# Patient Record
Sex: Male | Born: 1975 | Race: Black or African American | Hispanic: No | Marital: Married | Smoking: Never smoker
Health system: Southern US, Community
[De-identification: ages and names within clinical notes are randomized; demographics above are authoritative.]

## PROBLEM LIST (undated history)

## (undated) ENCOUNTER — Ambulatory Visit: Admission: EM | Payer: No Typology Code available for payment source

## (undated) DIAGNOSIS — Z8639 Personal history of other endocrine, nutritional and metabolic disease: Secondary | ICD-10-CM

## (undated) DIAGNOSIS — E669 Obesity, unspecified: Secondary | ICD-10-CM

## (undated) DIAGNOSIS — E039 Hypothyroidism, unspecified: Secondary | ICD-10-CM

## (undated) DIAGNOSIS — M545 Low back pain, unspecified: Secondary | ICD-10-CM

## (undated) DIAGNOSIS — G8929 Other chronic pain: Secondary | ICD-10-CM

## (undated) DIAGNOSIS — R7301 Impaired fasting glucose: Secondary | ICD-10-CM

## (undated) DIAGNOSIS — Z8042 Family history of malignant neoplasm of prostate: Secondary | ICD-10-CM

## (undated) HISTORY — DX: Hypothyroidism, unspecified: E03.9

## (undated) HISTORY — DX: Low back pain: M54.5

## (undated) HISTORY — DX: Family history of malignant neoplasm of prostate: Z80.42

## (undated) HISTORY — DX: Low back pain, unspecified: M54.50

## (undated) HISTORY — DX: Obesity, unspecified: E66.9

## (undated) HISTORY — DX: Personal history of other endocrine, nutritional and metabolic disease: Z86.39

## (undated) HISTORY — DX: Other chronic pain: G89.29

## (undated) HISTORY — DX: Impaired fasting glucose: R73.01

---

## 2002-04-22 ENCOUNTER — Emergency Department (HOSPITAL_COMMUNITY): Admission: EM | Admit: 2002-04-22 | Discharge: 2002-04-22 | Payer: Self-pay | Admitting: Emergency Medicine

## 2003-01-20 ENCOUNTER — Emergency Department (HOSPITAL_COMMUNITY): Admission: EM | Admit: 2003-01-20 | Discharge: 2003-01-20 | Payer: Self-pay | Admitting: Emergency Medicine

## 2003-01-24 ENCOUNTER — Emergency Department (HOSPITAL_COMMUNITY): Admission: EM | Admit: 2003-01-24 | Discharge: 2003-01-24 | Payer: Self-pay | Admitting: Emergency Medicine

## 2003-02-05 ENCOUNTER — Ambulatory Visit (HOSPITAL_COMMUNITY): Admission: RE | Admit: 2003-02-05 | Discharge: 2003-02-05 | Payer: Self-pay | Admitting: Cardiology

## 2003-03-09 ENCOUNTER — Emergency Department (HOSPITAL_COMMUNITY): Admission: EM | Admit: 2003-03-09 | Discharge: 2003-03-09 | Payer: Self-pay | Admitting: Emergency Medicine

## 2003-03-10 ENCOUNTER — Emergency Department (HOSPITAL_COMMUNITY): Admission: EM | Admit: 2003-03-10 | Discharge: 2003-03-10 | Payer: Self-pay | Admitting: Family Medicine

## 2003-04-15 ENCOUNTER — Emergency Department (HOSPITAL_COMMUNITY): Admission: AD | Admit: 2003-04-15 | Discharge: 2003-04-15 | Payer: Self-pay | Admitting: Family Medicine

## 2004-04-24 ENCOUNTER — Emergency Department (HOSPITAL_COMMUNITY): Admission: EM | Admit: 2004-04-24 | Discharge: 2004-04-24 | Payer: Self-pay | Admitting: Emergency Medicine

## 2004-05-16 ENCOUNTER — Ambulatory Visit: Payer: Self-pay | Admitting: Internal Medicine

## 2004-05-18 ENCOUNTER — Encounter (HOSPITAL_COMMUNITY): Admission: RE | Admit: 2004-05-18 | Discharge: 2004-08-16 | Payer: Self-pay | Admitting: Internal Medicine

## 2004-05-23 ENCOUNTER — Ambulatory Visit: Payer: Self-pay | Admitting: Internal Medicine

## 2004-06-21 ENCOUNTER — Ambulatory Visit: Payer: Self-pay | Admitting: Internal Medicine

## 2004-10-06 ENCOUNTER — Ambulatory Visit: Payer: Self-pay | Admitting: Internal Medicine

## 2004-10-13 ENCOUNTER — Ambulatory Visit: Payer: Self-pay | Admitting: Internal Medicine

## 2004-11-18 ENCOUNTER — Ambulatory Visit: Payer: Self-pay | Admitting: Internal Medicine

## 2004-11-30 ENCOUNTER — Ambulatory Visit: Payer: Self-pay | Admitting: Internal Medicine

## 2005-04-19 ENCOUNTER — Emergency Department (HOSPITAL_COMMUNITY): Admission: EM | Admit: 2005-04-19 | Discharge: 2005-04-19 | Payer: Self-pay | Admitting: Emergency Medicine

## 2005-05-10 ENCOUNTER — Emergency Department (HOSPITAL_COMMUNITY): Admission: EM | Admit: 2005-05-10 | Discharge: 2005-05-10 | Payer: Self-pay | Admitting: Emergency Medicine

## 2005-11-14 ENCOUNTER — Emergency Department (HOSPITAL_COMMUNITY): Admission: EM | Admit: 2005-11-14 | Discharge: 2005-11-15 | Payer: Self-pay | Admitting: Emergency Medicine

## 2005-12-01 DIAGNOSIS — IMO0002 Reserved for concepts with insufficient information to code with codable children: Secondary | ICD-10-CM

## 2005-12-01 DIAGNOSIS — E0590 Thyrotoxicosis, unspecified without thyrotoxic crisis or storm: Secondary | ICD-10-CM | POA: Insufficient documentation

## 2006-01-07 ENCOUNTER — Emergency Department (HOSPITAL_COMMUNITY): Admission: EM | Admit: 2006-01-07 | Discharge: 2006-01-07 | Payer: Self-pay | Admitting: Emergency Medicine

## 2006-03-12 ENCOUNTER — Emergency Department (HOSPITAL_COMMUNITY): Admission: EM | Admit: 2006-03-12 | Discharge: 2006-03-12 | Payer: Self-pay | Admitting: Emergency Medicine

## 2006-10-02 ENCOUNTER — Emergency Department (HOSPITAL_COMMUNITY): Admission: EM | Admit: 2006-10-02 | Discharge: 2006-10-02 | Payer: Self-pay | Admitting: *Deleted

## 2007-01-24 DIAGNOSIS — Z8639 Personal history of other endocrine, nutritional and metabolic disease: Secondary | ICD-10-CM

## 2007-01-24 HISTORY — DX: Personal history of other endocrine, nutritional and metabolic disease: Z86.39

## 2008-08-23 ENCOUNTER — Emergency Department (HOSPITAL_COMMUNITY): Admission: EM | Admit: 2008-08-23 | Discharge: 2008-08-23 | Payer: Self-pay | Admitting: Emergency Medicine

## 2010-05-07 ENCOUNTER — Emergency Department (HOSPITAL_COMMUNITY)
Admission: EM | Admit: 2010-05-07 | Discharge: 2010-05-07 | Disposition: A | Discharge status: Home / Self Care | Payer: Self-pay | Attending: Emergency Medicine | Admitting: Emergency Medicine

## 2010-05-07 ENCOUNTER — Emergency Department (HOSPITAL_COMMUNITY): Payer: Self-pay

## 2010-05-07 DIAGNOSIS — M545 Low back pain, unspecified: Secondary | ICD-10-CM | POA: Insufficient documentation

## 2010-05-07 DIAGNOSIS — I10 Essential (primary) hypertension: Secondary | ICD-10-CM | POA: Insufficient documentation

## 2010-05-07 DIAGNOSIS — R229 Localized swelling, mass and lump, unspecified: Secondary | ICD-10-CM | POA: Insufficient documentation

## 2010-05-07 DIAGNOSIS — M5380 Other specified dorsopathies, site unspecified: Secondary | ICD-10-CM | POA: Insufficient documentation

## 2010-05-07 LAB — URINALYSIS, ROUTINE W REFLEX MICROSCOPIC
Glucose, UA: NEGATIVE mg/dL
Protein, ur: NEGATIVE mg/dL
Specific Gravity, Urine: 1.023 (ref 1.005–1.030)

## 2010-10-06 ENCOUNTER — Emergency Department (HOSPITAL_COMMUNITY)
Admission: EM | Admit: 2010-10-06 | Discharge: 2010-10-06 | Disposition: A | Discharge status: Home / Self Care | Payer: Self-pay | Attending: Emergency Medicine | Admitting: Emergency Medicine

## 2010-10-06 DIAGNOSIS — K089 Disorder of teeth and supporting structures, unspecified: Secondary | ICD-10-CM | POA: Insufficient documentation

## 2010-10-06 DIAGNOSIS — I10 Essential (primary) hypertension: Secondary | ICD-10-CM | POA: Insufficient documentation

## 2010-10-06 DIAGNOSIS — K047 Periapical abscess without sinus: Secondary | ICD-10-CM | POA: Insufficient documentation

## 2010-10-08 ENCOUNTER — Emergency Department (HOSPITAL_COMMUNITY)
Admission: EM | Admit: 2010-10-08 | Discharge: 2010-10-08 | Disposition: A | Discharge status: Home / Self Care | Payer: Self-pay | Attending: Emergency Medicine | Admitting: Emergency Medicine

## 2010-10-08 DIAGNOSIS — K055 Other periodontal diseases: Secondary | ICD-10-CM | POA: Insufficient documentation

## 2010-10-08 DIAGNOSIS — I10 Essential (primary) hypertension: Secondary | ICD-10-CM | POA: Insufficient documentation

## 2010-10-08 DIAGNOSIS — Z9889 Other specified postprocedural states: Secondary | ICD-10-CM | POA: Insufficient documentation

## 2010-10-08 LAB — DIFFERENTIAL
Lymphs Abs: 1.9 10*3/uL (ref 0.7–4.0)
Monocytes Absolute: 0.8 10*3/uL (ref 0.1–1.0)
Monocytes Relative: 11 % (ref 3–12)
Neutro Abs: 4 10*3/uL (ref 1.7–7.7)
Neutrophils Relative %: 59 % (ref 43–77)

## 2010-10-08 LAB — PROTIME-INR
INR: 1.01 (ref 0.00–1.49)
Prothrombin Time: 13.5 seconds (ref 11.6–15.2)

## 2010-10-08 LAB — CBC
HCT: 37.9 % — ABNORMAL LOW (ref 39.0–52.0)
MCH: 28.9 pg (ref 26.0–34.0)
MCHC: 34.8 g/dL (ref 30.0–36.0)
MCV: 82.9 fL (ref 78.0–100.0)
Platelets: 233 10*3/uL (ref 150–400)
RDW: 12.9 % (ref 11.5–15.5)

## 2010-10-08 LAB — APTT: aPTT: 28 seconds (ref 24–37)

## 2012-03-14 IMAGING — CR DG LUMBAR SPINE COMPLETE 4+V
5 series · 5 of 5 positions shown · non-contrast
Comparison: 10/02/2006.

CLINICAL DATA: Low back pain.

LUMBAR SPINE - COMPLETE 4+ VIEW

[t l-spine a.p.]
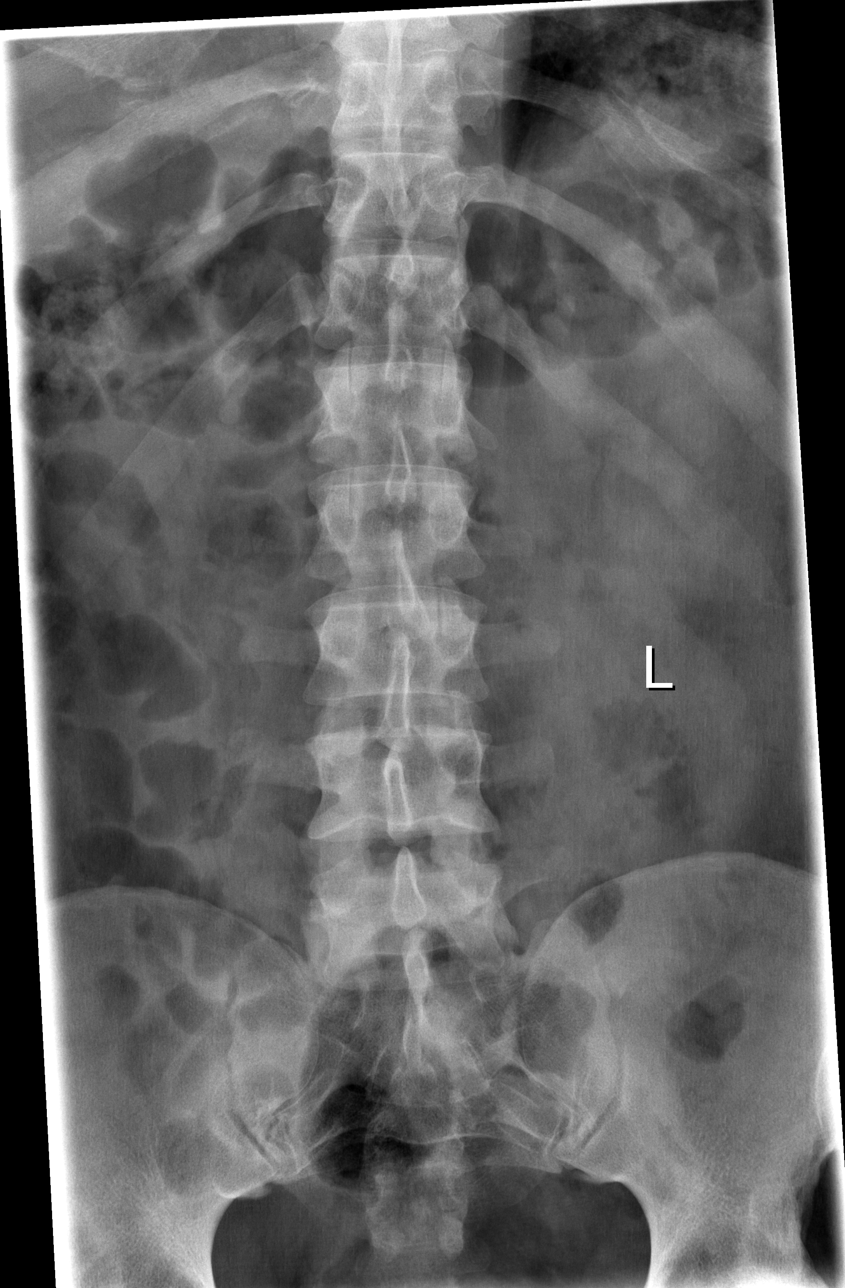

[t l-spine oblique exposure (1 of 2)]
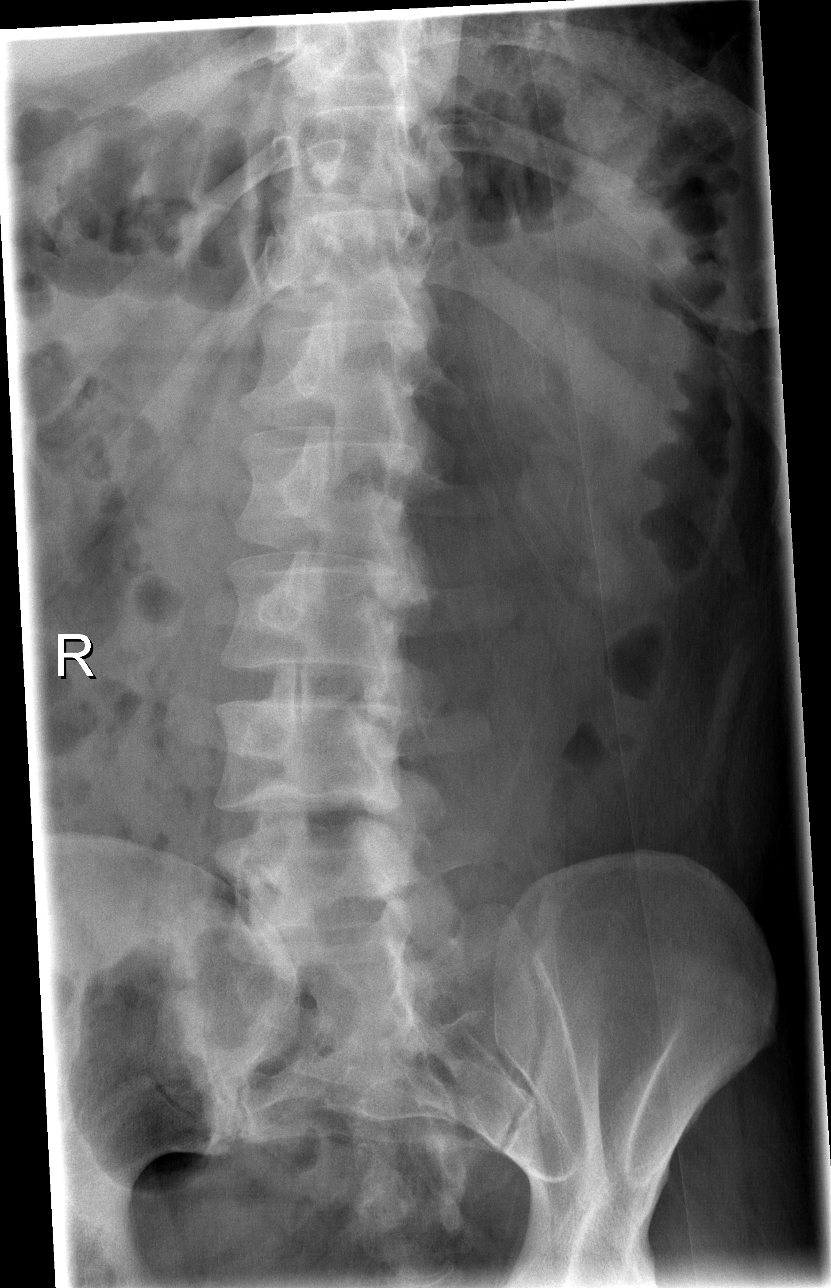

[t l-spine oblique exposure (2 of 2)]
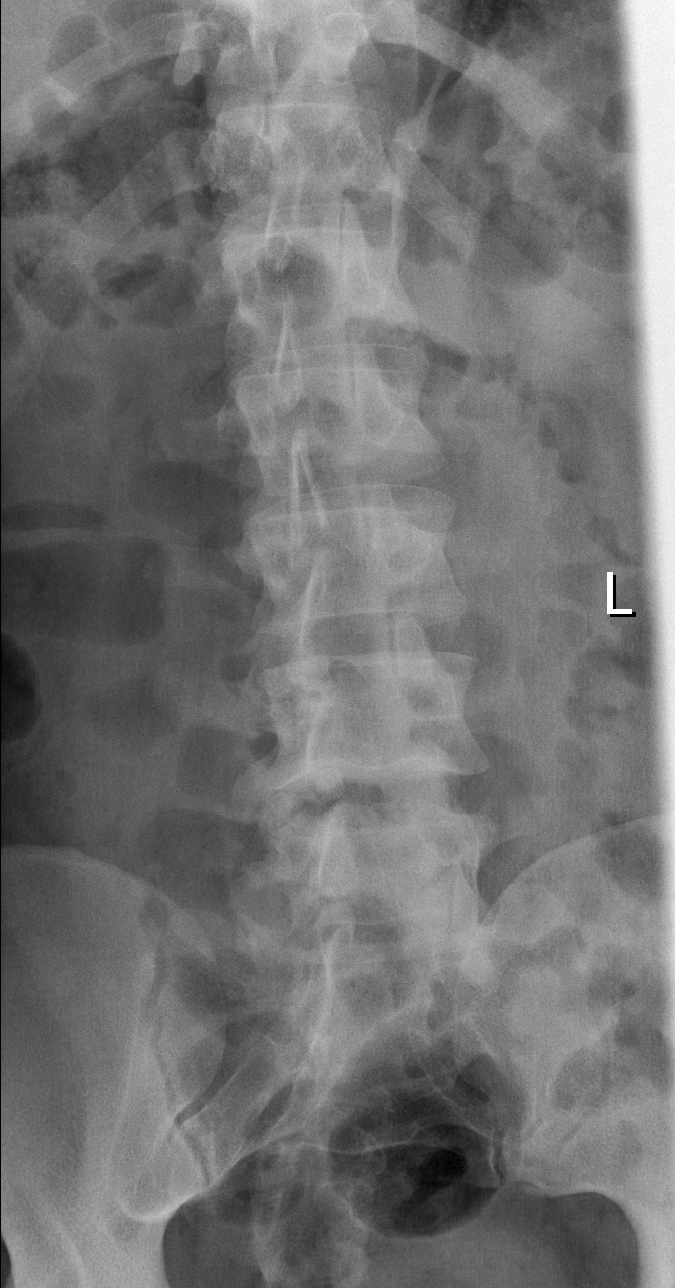

[t l-spine lat]
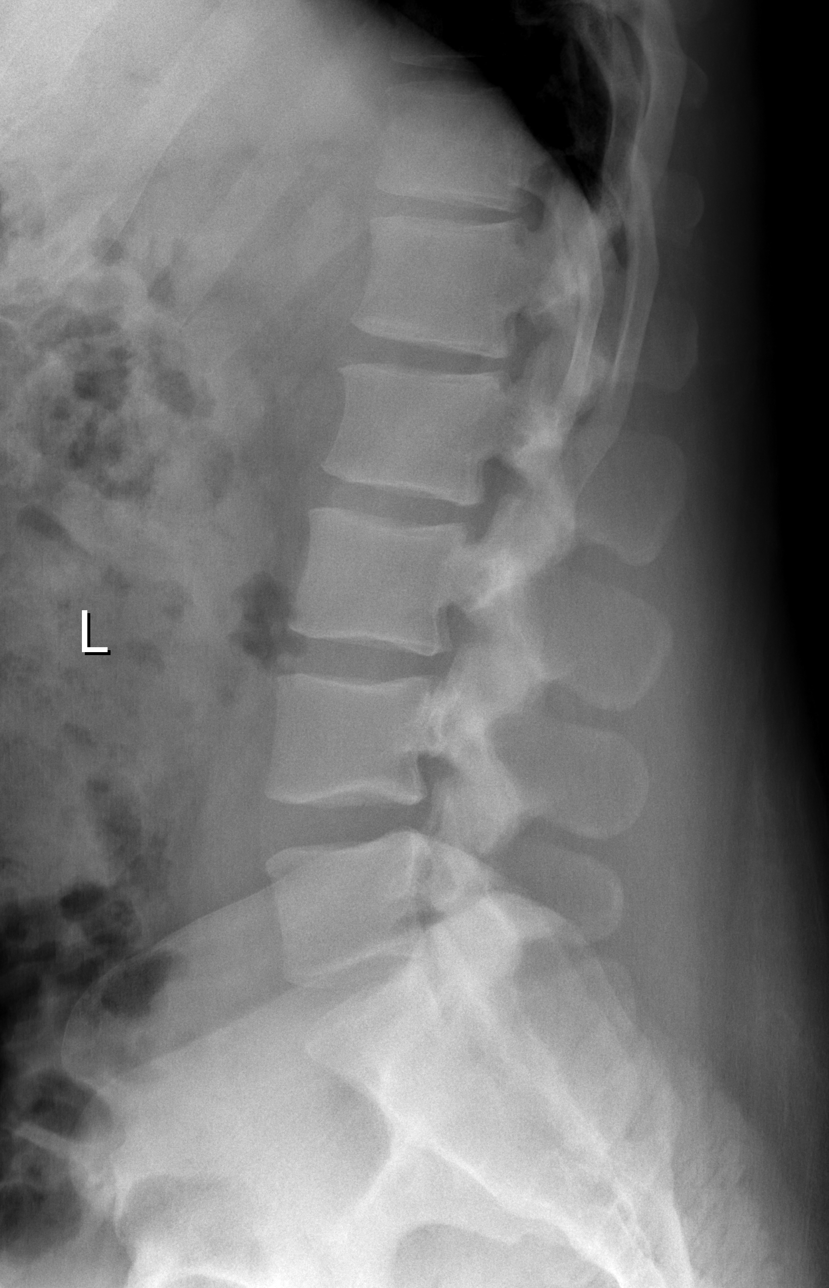

[t l-spine l5-s1 spot]
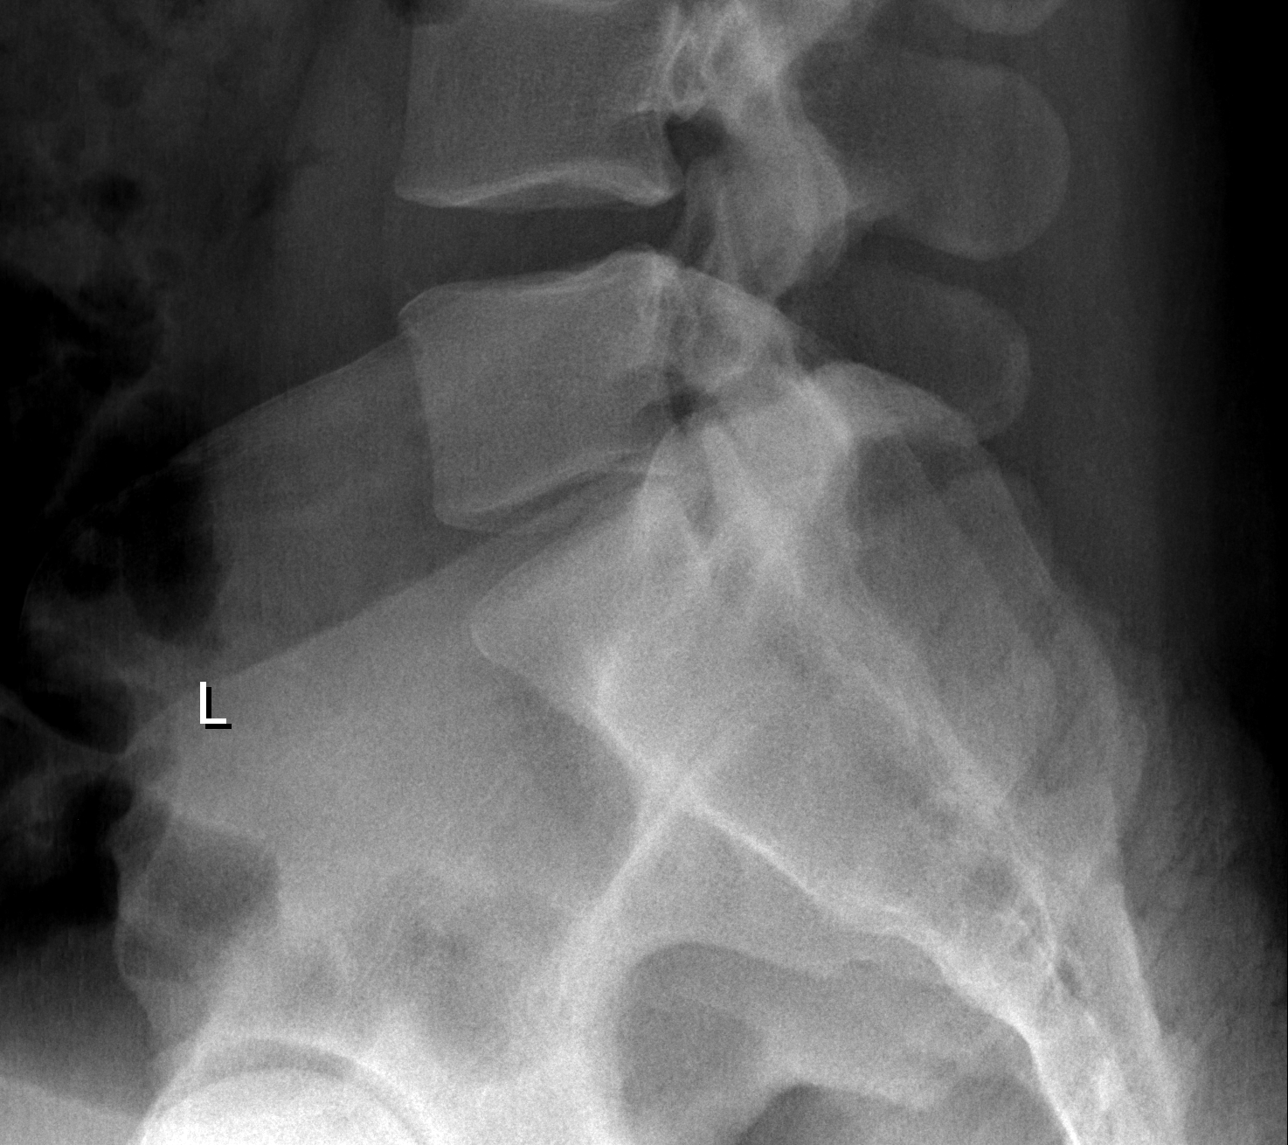

[5 of 5 positions shown; findings below may reference images not displayed]

FINDINGS: Five non-rib bearing lumbar type vertebral bodies are
present.  The vertebral body heights and disc spaces are preserved.
No acute fracture or traumatic subluxation is evident.
IMPRESSION: Negative lumbar spine.

## 2013-04-30 ENCOUNTER — Emergency Department (HOSPITAL_COMMUNITY)
Admission: EM | Admit: 2013-04-30 | Discharge: 2013-04-30 | Disposition: A | Discharge status: Home / Self Care | Payer: BC Managed Care – PPO | Attending: Emergency Medicine | Admitting: Emergency Medicine

## 2013-04-30 ENCOUNTER — Encounter (HOSPITAL_COMMUNITY): Payer: Self-pay | Admitting: Emergency Medicine

## 2013-04-30 DIAGNOSIS — L720 Epidermal cyst: Secondary | ICD-10-CM

## 2013-04-30 DIAGNOSIS — F1210 Cannabis abuse, uncomplicated: Secondary | ICD-10-CM | POA: Insufficient documentation

## 2013-04-30 DIAGNOSIS — L723 Sebaceous cyst: Secondary | ICD-10-CM | POA: Insufficient documentation

## 2013-04-30 MED ORDER — CYCLOBENZAPRINE HCL 10 MG PO TABS: 10.0000 mg | ORAL_TABLET | Freq: Two times a day (BID) | ORAL | Status: DC | PRN

## 2013-04-30 MED ORDER — CEPHALEXIN 500 MG PO CAPS: 500.0000 mg | ORAL_CAPSULE | Freq: Four times a day (QID) | ORAL | Status: DC

## 2013-04-30 NOTE — ED Notes (Signed)
Pt c/o lower back pain intermittent x 1 month.  Pt denies injury/trauma.  Pt ambulatory without assistance.

## 2013-04-30 NOTE — ED Provider Notes (Signed)
Medical screening examination/treatment/procedure(s) were performed by non-physician practitioner and as supervising physician I was immediately available for consultation/collaboration.   EKG Interpretation None        Junius ArgyleForrest S Dajohn Ellender, MD 04/30/13 2203

## 2013-04-30 NOTE — Discharge Instructions (Signed)
Epidermal Cyst An epidermal cyst is sometimes called a sebaceous cyst, epidermal inclusion cyst, or infundibular cyst. These cysts usually contain a substance that looks "pasty" or "cheesy" and may have a bad smell. This substance is a protein called keratin. Epidermal cysts are usually found on the face, neck, or trunk. Epidermal cysts are usually small, painless, slow-growing bumps or lumps that move freely under the skin. It is important not to try to pop them. This may cause an infection and lead to tenderness and swelling. CAUSES  Epidermal cysts may be caused by a deep penetrating injury to the skin or a plugged hair follicle, often associated with acne. SYMPTOMS  Epidermal cysts can become inflamed and cause:  Redness.  Tenderness.  Increased temperature of the skin over the bumps or lumps.  Grayish-white, bad smelling material that drains from the bump or lump. DIAGNOSIS  Epidermal cysts are easily diagnosed by your caregiver during an exam. Rarely, a tissue sample (biopsy) may be taken to rule out other conditions that may resemble epidermal cysts. TREATMENT   Epidermal cysts often get better and disappear on their own. They are rarely ever cancerous.  If a cyst becomes infected, it may become inflamed and tender. This may require opening and draining the cyst. Treatment with antibiotics may be necessary. When the infection is gone, the cyst may be removed with minor surgery.  Small, inflamed cysts can often be treated with antibiotics or by injecting steroid medicines.  Sometimes, epidermal cysts become large and bothersome. If this happens, surgical removal in your caregiver's office may be necessary. HOME CARE INSTRUCTIONS  Only take over-the-counter or prescription medicines as directed by your caregiver.  Take your antibiotics as directed. Finish them even if you start to feel better. SEEK MEDICAL CARE IF:   Your cyst becomes tender, red, or swollen.  Your condition is  not improving or is getting worse.  You have any other questions or concerns. MAKE SURE YOU:  Understand these instructions.  Will watch your condition.  Will get help right away if you are not doing well or get worse. Document Released: 12/11/2003 Document Revised: 04/03/2011 Document Reviewed: 07/18/2010 [facility] Patient Information 2014 [employer], [place].

## 2013-04-30 NOTE — ED Provider Notes (Signed)
CSN: [id number]     Arrival date & time 04/30/13  1521 History   First MD Initiated Contact with Patient 04/30/13 1537    This chart was scribed for Darnelle Going, a non-physician practitioner working with No att. providers found by Lewanda Rife, ED Scribe. This patient was seen in room TR08C/TR08C and the patient's care was started at 6:31 PM      Chief Complaint  Patient presents with  . Back Pain     (Consider location/radiation/quality/duration/timing/severity/associated sxs/prior Treatment) The history is provided by the patient. No language interpreter was used.   HPI Comments: Troy Alvarado is a 38 y.o. male who presents to the Emergency Department complaining of chronic waxing and waning low back pain onset 1 year ago. Denies any precipitating factors. Describes pain as shooting down bilateral legs occasionally. Reports pain is exacerbated by touch and movement. Denies any alleviating factors. Denies associated numbness, and neck pain. Denies urinary or fecal incontinence, urinary retention, perineal/saddle paresthesias, fever, and IV drug use.  History reviewed. No pertinent past medical history. History reviewed. No pertinent past surgical history. No family history on file. History  Substance Use Topics  . Smoking status: Never Smoker   . Smokeless tobacco: Not on file  . Alcohol Use: Yes    Review of Systems  Constitutional: Negative for fever.  Musculoskeletal: Positive for back pain. Negative for neck pain.  Neurological: Negative.   Psychiatric/Behavioral: Negative for confusion.      Allergies  Review of patient's allergies indicates no known allergies.  Home Medications   Current Outpatient Rx  Name  Route  Sig  Dispense  Refill  . cephALEXin (KEFLEX) 500 MG capsule   Oral   Take 1 capsule (500 mg total) by mouth 4 (four) times daily.   40 capsule   0   . cyclobenzaprine (FLEXERIL) 10 MG tablet   Oral   Take 1 tablet (10 mg total) by  mouth 2 (two) times daily as needed for muscle spasms.   20 tablet   0    BP 144/94  Pulse 80  Temp(Src) 97.9 F (36.6 C) (Oral)  Resp 16  Ht 5\' 11"  (1.803 m)  Wt 223 lb 6.4 oz (101.334 kg)  BMI 31.17 kg/m2  SpO2 99% Physical Exam  Nursing note and vitals reviewed. Constitutional: He is oriented to person, place, and time. He appears well-developed and well-nourished. No distress.  HENT:  Head: Normocephalic and atraumatic.  Eyes: EOM are normal.  Neck: Neck supple. No tracheal deviation present.  Cardiovascular: Normal rate.   Pulmonary/Chest: Effort normal. No respiratory distress.  Musculoskeletal: Normal range of motion. He exhibits tenderness.  TTP of right paraspinal lumbar region   No midline C-spine, T-spine, or L-spine tenderness with no step-offs or deformities noted    Neurological: He is alert and oriented to person, place, and time.  Skin: Skin is warm and dry. No erythema.  Soft 6 cm by 3 cm oval shaped cyst deeper into the soft tissue over right paraspinal region. It is smooth and mobile. It is mildly TTP. It has no associated cellulitis, no induration or no swelling.   Psychiatric: He has a normal mood and affect. His behavior is normal.    ED Course  Procedures (including critical care time) COORDINATION OF CARE:  Nursing notes reviewed. Vital signs reviewed. Initial pt interview and examination performed.   Filed Vitals:   04/30/13 1527  BP: 144/94  Pulse: 80  Temp: 97.9 F (36.6 C)  TempSrc:  Oral  Resp: 16  Height: 5\' 11"  (1.803 m)  Weight: 223 lb 6.4 oz (101.334 kg)  SpO2: 99%      MDM   Final diagnoses:  Cyst of skin and subcutaneous tissue   Pt with cyst that has most likely been there for e a long time. Could be muscular back pain that caused him to notice cyst or the cyst is starting to become tender/become infected. Will give referral to Derm and Gen Surg as both should be able remove cyst if symptomatic.   38 y.o.Troy Alvarado's  evaluation in the Emergency Department is complete. It has been determined that no acute conditions requiring further emergency intervention are present at this time. The patient/guardian have been advised of the diagnosis and plan. We have discussed signs and symptoms that warrant return to the ED, such as changes or worsening in symptoms.  Vital signs are stable at discharge. Filed Vitals:   04/30/13 1527  BP: 144/94  Pulse: 80  Temp: 97.9 F (36.6 C)  Resp: 16    Patient/guardian has voiced understanding and agreed to follow-up with the PCP or specialist.  I personally performed the services described in this documentation, which was scribed in my presence. The recorded information has been reviewed and is accurate.   Dorthula Matasiffany G Tymel Conely, PA-C 04/30/13 [phone]

## 2013-04-30 NOTE — ED Notes (Signed)
Pt comfortable with discharge and follow up instructions. Prescriptions x2. 

## 2013-09-24 ENCOUNTER — Encounter: Payer: Self-pay | Admitting: Medical

## 2013-09-24 ENCOUNTER — Ambulatory Visit (INDEPENDENT_AMBULATORY_CARE_PROVIDER_SITE_OTHER): Payer: BC Managed Care – PPO | Admitting: Medical

## 2013-09-24 VITALS — BP 110/80 | HR 72 | Temp 97.9°F | Resp 16 | Ht 71.0 in | Wt 231.0 lb

## 2013-09-24 DIAGNOSIS — E669 Obesity, unspecified: Secondary | ICD-10-CM

## 2013-09-24 DIAGNOSIS — Z23 Encounter for immunization: Secondary | ICD-10-CM

## 2013-09-24 DIAGNOSIS — Z862 Personal history of diseases of the blood and blood-forming organs and certain disorders involving the immune mechanism: Secondary | ICD-10-CM

## 2013-09-24 DIAGNOSIS — Z8639 Personal history of other endocrine, nutritional and metabolic disease: Secondary | ICD-10-CM

## 2013-09-24 DIAGNOSIS — B353 Tinea pedis: Secondary | ICD-10-CM

## 2013-09-24 DIAGNOSIS — Z0000 Encounter for general adult medical examination without abnormal findings: Secondary | ICD-10-CM

## 2013-09-24 DIAGNOSIS — R002 Palpitations: Secondary | ICD-10-CM

## 2013-09-24 LAB — CBC
HEMATOCRIT: 42.5 % (ref 39.0–52.0)
Hemoglobin: 14.2 g/dL (ref 13.0–17.0)
MCH: 28.9 pg (ref 26.0–34.0)
MCHC: 33.4 g/dL (ref 30.0–36.0)
MCV: 86.4 fL (ref 78.0–100.0)
Platelets: 277 10*3/uL (ref 150–400)
RBC: 4.92 MIL/uL (ref 4.22–5.81)
RDW: 13.8 % (ref 11.5–15.5)
WBC: 4.1 10*3/uL (ref 4.0–10.5)

## 2013-09-24 LAB — LIPID PANEL
CHOL/HDL RATIO: 2.5 ratio
CHOLESTEROL: 175 mg/dL (ref 0–200)
HDL: 71 mg/dL (ref >39)
LDL Cholesterol: 94 mg/dL (ref 0–99)
TRIGLYCERIDES: 48 mg/dL (ref <150)
VLDL: 10 mg/dL (ref 0–40)

## 2013-09-24 LAB — HEMOGLOBIN A1C
HEMOGLOBIN A1C: 5.8 % — AB (ref <5.7)
Mean Plasma Glucose: 120 mg/dL — ABNORMAL HIGH (ref <117)

## 2013-09-24 LAB — POCT URINALYSIS DIPSTICK
BILIRUBIN UA: NEGATIVE
Glucose, UA: NEGATIVE
KETONES UA: NEGATIVE
LEUKOCYTES UA: NEGATIVE
NITRITE UA: NEGATIVE
PH UA: 5
RBC UA: NEGATIVE
SPEC GRAV UA: 1.015
UROBILINOGEN UA: NEGATIVE

## 2013-09-24 LAB — COMPREHENSIVE METABOLIC PANEL
ALBUMIN: 4.4 g/dL (ref 3.5–5.2)
ALT: 26 U/L (ref 0–53)
AST: 23 U/L (ref 0–37)
Alkaline Phosphatase: 54 U/L (ref 39–117)
BILIRUBIN TOTAL: 0.5 mg/dL (ref 0.2–1.2)
BUN: 16 mg/dL (ref 6–23)
CO2: 27 mEq/L (ref 19–32)
Calcium: 9.3 mg/dL (ref 8.4–10.5)
Chloride: 104 mEq/L (ref 96–112)
Creat: 0.94 mg/dL (ref 0.50–1.35)
Glucose, Bld: 92 mg/dL (ref 70–99)
Potassium: 4.3 mEq/L (ref 3.5–5.3)
SODIUM: 137 meq/L (ref 135–145)
Total Protein: 7.2 g/dL (ref 6.0–8.3)

## 2013-09-24 MED ORDER — TERBINAFINE HCL 1 % EX CREA: 1.0000 "application " | TOPICAL_CREAM | Freq: Two times a day (BID) | CUTANEOUS | Status: DC

## 2013-09-24 NOTE — Patient Instructions (Signed)
Thank you for giving me the opportunity to serve you today.    Your diagnosis today includes: Encounter Diagnoses  Name Primary?  . Routine general medical examination at a health care facility Yes  . Need for Tdap vaccination   . Palpitations   . History of hyperthyroidism   . Obesity, unspecified   . Tinea pedis of both feet      Specific recommendations today include:  We updated your Tetanus, Diptheria and pertussis vaccine today  Get your flu shot through work soon when available  I recommend you work on some weight loss through healthy low fat diet and gradually increasing your exercise  Begin Lamisil cream nightly to your feet and in between toes after your night time bath/shower.  It may take several weeks for the fungus to resolve  We will call with lab results  See your eye doctor and dentist yearly  Your blood pressure was normal today  Return pending labs.    I have included other useful information below for your review.  Athlete's Foot Athlete's foot (tinea pedis) is a fungal infection of the skin on the feet. It often occurs on the skin between the toes or underneath the toes. It can also occur on the soles of the feet. Athlete's foot is more likely to occur in hot, humid weather. Not washing your feet or changing your socks often enough can contribute to athlete's foot. The infection can spread from person to person (contagious). CAUSES Athlete's foot is caused by a fungus. This fungus thrives in warm, moist places. Most people get athlete's foot by sharing shower stalls, towels, and wet floors with an infected person. People with weakened immune systems, including those with diabetes, may be more likely to get athlete's foot. SYMPTOMS   Itchy areas between the toes or on the soles of the feet.  White, flaky, or scaly areas between the toes or on the soles of the feet.  Tiny, intensely itchy blisters between the toes or on the soles of the  feet.  Tiny cuts on the skin. These cuts can develop a bacterial infection.  Thick or discolored toenails. DIAGNOSIS  Your caregiver can usually tell what the problem is by doing a physical exam. Your caregiver may also take a skin sample from the rash area. The skin sample may be examined under a microscope, or it may be tested to see if fungus will grow in the sample. A sample may also be taken from your toenail for testing. TREATMENT  Over-the-counter and prescription medicines can be used to kill the fungus. These medicines are available as powders or creams. Your caregiver can suggest medicines for you. Fungal infections respond slowly to treatment. You may need to continue using your medicine for several weeks. PREVENTION   Do not share towels.  Wear sandals in wet areas, such as shared locker rooms and shared showers.  Keep your feet dry. Wear shoes that allow air to circulate. Wear cotton or wool socks. HOME CARE INSTRUCTIONS   Take medicines as directed by your caregiver. Do not use steroid creams on athlete's foot.  Keep your feet clean and cool. Wash your feet daily and dry them thoroughly, especially between your toes.  Change your socks every day. Wear cotton or wool socks. In hot climates, you may need to change your socks 2 to 3 times per day.  Wear sandals or canvas tennis shoes with good air circulation.  If you have blisters, soak your feet in Burow's  solution or Epsom salts for 20 to 30 minutes, 2 times a day to dry out the blisters. Make sure you dry your feet thoroughly afterward. SEEK MEDICAL CARE IF:   You have a fever.  You have swelling, soreness, warmth, or redness in your foot.  You are not getting better after 7 days of treatment.  You are not completely cured after 30 days.  You have any problems caused by your medicines. MAKE SURE YOU:   Understand these instructions.  Will watch your condition.  Will get help right away if you are not doing  well or get worse. Document Released: 01/07/2000 Document Revised: 04/03/2011 Document Reviewed: 10/28/2010 [facility] Patient Information 2015 [place], [place]. This information is not intended to replace advice given to you by your health care provider. Make sure you discuss any questions you have with your health care provider.

## 2013-09-24 NOTE — Progress Notes (Signed)
Subjective:   HPI  Troy Alvarado is a 38 y.o. male who presents for a complete physical.  New patient today. Was primarily using ED prior.    Preventative care: Last ophthalmology visit:yes Last dental visit:yes  Last colonoscopy:n/a Last prostate exam: ? Last EKG:yes and today Last labs:new  Prior vaccinations: TD or Tdap:09/24/13 Influenza:declined Pneumococcal:n/a Shingles/Zostavax:n/a  Advanced directive:n/a Health care power of attorney:n/a Living will:n/a  Concerns: Athlete's foot, otherwise no concerns, no medication for the feet, longstanding.   Occasional palpitations, brief, no frank chest pain, SOB.  Thinks he gets a little winded at times due to lack of exercise regularly.   Reviewed their medical, surgical, family, social, medication, and allergy history and updated chart as appropriate.  Past Medical History  Diagnosis Date  . History of hyperthyroidism 2009    prior oral radioablation therapy  . Obesity     History reviewed. No pertinent past surgical history.  History   Social History  . Marital Status: Single    Spouse Name: N/A    Number of Children: N/A  . Years of Education: N/A   Occupational History  . warehouse work/distribution [employer]   Social History Main Topics  . Smoking status: Never Smoker   . Smokeless tobacco: Not on file  . Alcohol Use: 4.8 oz/week    5 Cans of beer, 3 Shots of liquor per week  . Drug Use: Yes    Special: Marijuana  . Sexual Activity: Not on file   Other Topics Concern  . Not on file   Social History Narrative   Married, daughter and one on the way as of 09/2013.  Walks for exercise on the job.       Family History  Problem Relation Age of Onset  . Cirrhosis Mother     died of cirrhosis  . Alcohol abuse Mother   . Crohn's disease Mother   . Stroke Mother   . Hypertension Father   . Diabetes Brother   . Heart disease Neg Hx   . Cancer Neg Hx     No current outpatient prescriptions on  file.  No Known Allergies    Review of Systems Constitutional: -fever, -chills, -sweats, -unexpected weight change, -decreased appetite, -fatigue Allergy: -sneezing, -itching, -congestion Dermatology: -changing moles, --rash, -lumps ENT: -runny nose, -ear pain, -sore throat, -hoarseness, -sinus pain, -teeth pain, - ringing in ears, -hearing loss, -nosebleeds Cardiology: +chest pain, -palpitations, -swelling, -difficulty breathing when lying flat, -waking up short of breath Respiratory: -cough, -shortness of breath, -difficulty breathing with exercise or exertion, -wheezing, -coughing up blood Gastroenterology: -abdominal pain, -nausea, -vomiting, -diarrhea, -constipation, -blood in stool, -changes in bowel movement, -difficulty swallowing or eating Hematology: -bleeding, -bruising  Musculoskeletal: -joint aches, -muscle aches, -joint swelling, +back pain, -neck pain, -cramping, -changes in gait Ophthalmology: denies vision changes, eye redness, itching, discharge Urology: -burning with urination, -difficulty urinating, -blood in urine, -urinary frequency, -urgency, -incontinence Neurology: -headache, -weakness, -tingling, -numbness, -memory loss, -falls, -dizziness Psychology: -depressed mood, -agitation, -sleep problems     Objective:   Physical Exam  BP 110/80  Pulse 72  Temp(Src) 97.9 F (36.6 C) (Oral)  Resp 16  Ht  (1.803 m)  Wt 231 lb (104.781 kg)  BMI 32.23 kg/m2  General appearance: alert, no distress, WD/WN, AA male Skin: tattoos bilat forearms and upper arms, few scattered macules, no worrisome lesions.  He has maceration and white coloration between toes of both feet suggestive of tinea pedis HEENT: normocephalic, conjunctiva/corneas normal, sclerae  anicteric, PERRLA, EOMi, nares patent, no discharge or erythema, pharynx normal Oral cavity: MMM, tongue normal, teeth in good repair Neck: supple, no lymphadenopathy, no thyromegaly, no masses, normal ROM, no  bruits Chest: non tender, normal shape and expansion Heart: RRR, normal S1, S2, no murmurs Lungs: CTA bilaterally, no wheezes, rhonchi, or rales Abdomen: +bs, soft, non tender, non distended, no masses, no hepatomegaly, no splenomegaly, no bruits Back: non tender, normal ROM, no scoliosis Musculoskeletal: upper extremities non tender, no obvious deformity, normal ROM throughout, lower extremities non tender, no obvious deformity, normal ROM throughout Extremities: no edema, no cyanosis, no clubbing Pulses: 2+ symmetric, upper and lower extremities, normal cap refill Neurological: alert, oriented x 3, CN2-12 intact, strength normal upper extremities and lower extremities, sensation normal throughout, DTRs 2+ throughout, no cerebellar signs, gait normal Psychiatric: normal affect, behavior normal, pleasant  GU: normal male external genitalia, circumcised, nontender, no masses, no hernia, no lymphadenopathy Rectal: deferred   Adult ECG Report  Indication: palpitations, physical  Rate: 75 bpm  Rhythm: normal sinus rhythm  QRS Axis: -9 degrees  PR Interval:  QRS Duration: 86ms  QTc:  Conduction Disturbances: none  Other Abnormalities: T wave inversion III  Patient's cardiac risk factors are: male gender and obesity (BMI >= 30 kg/m2).  EKG comparison: none  Narrative Interpretation: left axis deviation, T wave inversion III only     Assessment and Plan :      Encounter Diagnoses  Name Primary?  . Routine general medical examination at a health care facility Yes  . Need for Tdap vaccination   . Palpitations   . History of hyperthyroidism   . Obesity, unspecified   . Tinea pedis of both feet     Physical exam - discussed healthy lifestyle, diet, exercise, preventative care, vaccinations, and addressed their concerns.    Counseled on the Tdap (tetanus, diptheria, and acellular pertussis) vaccine.  Vaccine information sheet given. Tdap vaccine given after consent  obtained.  Specific recommendations today include:  We updated your Tetanus, Diptheria and pertussis vaccine today  Get your flu shot through work soon when available  I recommend you work on some weight loss through healthy low fat diet and gradually increasing your exercise  Begin Lamisil cream nightly to your feet and in between toes after your night time bath/shower.  It may take several weeks for the fungus to resolve  We will call with lab results  See your eye doctor and dentist yearly  Your blood pressure was normal today   Follow-up pending labs

## 2013-09-25 ENCOUNTER — Other Ambulatory Visit: Payer: Self-pay | Admitting: Medical

## 2013-09-25 LAB — TSH: TSH: 15.833 u[IU]/mL — AB (ref 0.350–4.500)

## 2013-09-25 LAB — T4, FREE: Free T4: 0.63 ng/dL — ABNORMAL LOW (ref 0.80–1.80)

## 2013-09-25 LAB — T3: T3 TOTAL: 61.9 ng/dL — AB (ref 80.0–204.0)

## 2013-09-25 MED ORDER — LEVOTHYROXINE SODIUM 75 MCG PO TABS: 75.0000 ug | ORAL_TABLET | Freq: Every day | ORAL | Status: DC

## 2013-10-24 ENCOUNTER — Encounter: Payer: Self-pay | Admitting: Medical

## 2013-10-24 ENCOUNTER — Ambulatory Visit (INDEPENDENT_AMBULATORY_CARE_PROVIDER_SITE_OTHER): Payer: BC Managed Care – PPO | Admitting: Medical

## 2013-10-24 VITALS — BP 122/80 | HR 80 | Temp 97.6°F | Resp 16 | Wt 230.0 lb

## 2013-10-24 DIAGNOSIS — B353 Tinea pedis: Secondary | ICD-10-CM

## 2013-10-24 DIAGNOSIS — E032 Hypothyroidism due to medicaments and other exogenous substances: Secondary | ICD-10-CM

## 2013-10-24 DIAGNOSIS — R7301 Impaired fasting glucose: Secondary | ICD-10-CM

## 2013-10-24 DIAGNOSIS — Z23 Encounter for immunization: Secondary | ICD-10-CM

## 2013-10-24 LAB — T4, FREE: Free T4: 1.23 ng/dL (ref 0.80–1.80)

## 2013-10-24 LAB — TSH: TSH: 2.318 u[IU]/mL (ref 0.350–4.500)

## 2013-10-24 MED ORDER — TERBINAFINE HCL 1 % EX CREA: 1.0000 "application " | TOPICAL_CREAM | Freq: Two times a day (BID) | CUTANEOUS | Status: DC

## 2013-10-24 NOTE — Progress Notes (Signed)
   Subjective:   Troy Alvarado is a 38 y.o. male presenting on 10/24/2013 with recheck thyroid  Last visit for physical labs showed impaired glucose and hypothyroidism.   He did begin the levothyroxine.   He notes no change in how he feels.  Feels fine other than some fatigue.  No weight changes of recent, no hair and skin changes other than fungus of right feet better, but not all the way resolved on Lamisil cream.  Here to discuss labs tests from last visit.   Past Medical History  Diagnosis Date  . History of hyperthyroidism 2009    prior oral radioablation therapy  . Obesity    Review of Systems ROS as in subjective     Objective:    Filed Vitals:   10/24/13 1342  BP: 122/80  Pulse: 80  Temp: 97.6 F (36.4 C)  Resp: 16    General appearance: alert, no distress, WD/WN Neck: supple, no lymphadenopathy, no thyromegaly, no masses Heart: RRR, normal S1, S2, no murmurs Pulses: 2+ symmetric, upper and lower extremities, normal cap refill Skin: good improvement of tinea, but still some cracking and erythema between toes on right foot     Assessment: Encounter Diagnoses  Name Primary?  . 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 Impaired fasting blood sugar   . Flu vaccine need   . Tinea pedis of right foot      Plan: Hypothyroidism - he is compliant with new medication for hypothyroidism added last visit after labs abnormal.  He has hx/o radioiodine therapy for hyperthyroidism.   Labs today.  Discussed diagnosis, treatment, proper use of medication.   Impaired glucose - discussed need to c/t healthy diet, routine exercise, weight loss efforts.   Tinea pedis - c/t Lamisil another several weeks until resolved  Counseled on the influenza virus vaccine.  Vaccine information sheet given.  Influenza vaccine given after consent obtained.   Tonye BecketDamien was seen today for recheck thyroid.  Diagnoses and associated orders for this visit:  Hypothyroidism due to  medication - TSH - T4, free - Thyroid Peroxidase Antibody - Prolactin  Impaired fasting blood sugar  Flu vaccine need - Flu Vaccine QUAD 36+ mos PF IM (Fluarix Quad PF)  Tinea pedis of right foot  Other Orders - terbinafine (LAMISIL AT) 1 % cream; Apply 1 application topically 2 (two) times daily.     Return pending labs.

## 2013-10-25 LAB — PROLACTIN: Prolactin: 2.9 ng/mL (ref 2.1–17.1)

## 2013-10-25 LAB — THYROID PEROXIDASE ANTIBODY: Thyroperoxidase Ab SerPl-aCnc: 509 IU/mL — ABNORMAL HIGH (ref <9)

## 2013-10-27 ENCOUNTER — Other Ambulatory Visit: Payer: Self-pay | Admitting: Medical

## 2013-10-27 MED ORDER — LEVOTHYROXINE SODIUM 75 MCG PO TABS: 75.0000 ug | ORAL_TABLET | Freq: Every day | ORAL | Status: DC

## 2013-11-11 ENCOUNTER — Encounter: Payer: Self-pay | Admitting: Medical

## 2013-11-25 ENCOUNTER — Other Ambulatory Visit: Payer: Self-pay | Admitting: Medical

## 2014-06-29 ENCOUNTER — Encounter: Payer: Self-pay | Admitting: Medical

## 2014-06-29 ENCOUNTER — Encounter: Payer: Self-pay | Admitting: Family Medicine

## 2014-06-29 ENCOUNTER — Ambulatory Visit (INDEPENDENT_AMBULATORY_CARE_PROVIDER_SITE_OTHER): Payer: BLUE CROSS/BLUE SHIELD | Admitting: Medical

## 2014-06-29 VITALS — BP 102/80 | HR 73 | Temp 98.2°F | Resp 15 | Wt 238.0 lb

## 2014-06-29 DIAGNOSIS — E669 Obesity, unspecified: Secondary | ICD-10-CM | POA: Diagnosis not present

## 2014-06-29 DIAGNOSIS — Z8042 Family history of malignant neoplasm of prostate: Secondary | ICD-10-CM | POA: Diagnosis not present

## 2014-06-29 DIAGNOSIS — R21 Rash and other nonspecific skin eruption: Secondary | ICD-10-CM | POA: Diagnosis not present

## 2014-06-29 DIAGNOSIS — E038 Other specified hypothyroidism: Secondary | ICD-10-CM | POA: Diagnosis not present

## 2014-06-29 DIAGNOSIS — M549 Dorsalgia, unspecified: Secondary | ICD-10-CM | POA: Diagnosis not present

## 2014-06-29 DIAGNOSIS — L089 Local infection of the skin and subcutaneous tissue, unspecified: Secondary | ICD-10-CM

## 2014-06-29 MED ORDER — CEPHALEXIN 500 MG PO CAPS: 500.0000 mg | ORAL_CAPSULE | Freq: Three times a day (TID) | ORAL | Status: DC

## 2014-06-29 MED ORDER — LEVOTHYROXINE SODIUM 75 MCG PO TABS: 75.0000 ug | ORAL_TABLET | Freq: Every day | ORAL | Status: DC

## 2014-06-29 MED ORDER — TRIAMCINOLONE ACETONIDE 0.1 % EX OINT: 1.0000 "application " | TOPICAL_OINTMENT | Freq: Two times a day (BID) | CUTANEOUS | Status: DC

## 2014-06-29 NOTE — Patient Instructions (Signed)
Encounter Diagnoses  Name Primary?  . Rash and nonspecific skin eruption Yes  . Skin infection     Recommendations  Begin Keflex oral antibiotic 3 times daily for 10 days  Use the Triamcinolone ointment 1-2 times daily on the patches  If not completley cleared up in 10 days, or if worsening in the meantime, let me know right away

## 2014-06-29 NOTE — Addendum Note (Signed)
Addended by: Jac CanavanYSINGER, Walid Haig S on: 06/29/2014 10:16 AM   Modules accepted: Orders, Level of Service

## 2014-06-29 NOTE — Progress Notes (Addendum)
Subjective: Here accompanied by wife today  Here for wound not healing on legs.   Was outside few weeks ago, felt some mosquito bites ,and since then, doesn't seem like scars/wounds are healing correctly.    Tried using some neosporin for a while, they hydrocortisone for a while, that didn't help either.   Been itching a lot.  The only areas of concern or bilat lower legs.  No household or work contacts with similar. Has had some pus drainage from the patches and they remain itchy.   No other aggravating or relieving factors.   Hypothyroidism - not taking medication of late, forgetting to take it.  No symptoms in general  Has some low back pain from time to time.  Not doing stretching in general.  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  Objective: BP 102/80 mmHg  Pulse 73  Temp(Src) 98.2 F (36.8 C) (Oral)  Resp 15  Wt 238 lb (107.956 kg)  Gen: wd, wn, nad Skin: right leg anterolaterally with 6cm x 5 cm flat patch of pink/brown skin with some flaking skin and crusted scab, similar small patches of bilat posterior and posterolaterally legs in the 3-4 cm diameter range.  About 4 -5 patches of each lower leg. Legs neurovascularly intact Back: nontender, ROM is without pain, but decreased flexibility in general, no scoliosis or deformity Abdomen: nontender, no mass No edema    Assessment: Encounter Diagnoses  Name Primary?  . Rash and nonspecific skin eruption Yes  . Skin infection   . Other specified hypothyroidism   . Family history of prostate cancer in father   . Bilateral back pain, unspecified location   . Obesity     Plan: Discussed findings, symptoms and treatment as below for skin infection and rash  hypothyroidism - discussed need for better compliance  Family hx/o prostate cancer in father in late 3960s.  Advised baseline prostate exam at next physical at age 39yo  bilat back pain, obesity -  likely musculoskeletal.  dicussed starting daily stretching regimen before work, core strengthening exercises as discussed.  discussed working on weight loss through healthy diet, exercise.    F/u prn.   Patient Instructions   Encounter Diagnoses  Name Primary?  . Rash and nonspecific skin eruption Yes  . Skin infection     Recommendations  Begin Keflex oral antibiotic 3 times daily for 10 days  Use the Triamcinolone ointment 1-2 times daily on the patches  If not completley cleared up in 10 days, or if worsening in the meantime, let me know right away

## 2015-07-24 HISTORY — PX: NO PAST SURGERIES: SHX2092

## 2015-07-30 ENCOUNTER — Ambulatory Visit (INDEPENDENT_AMBULATORY_CARE_PROVIDER_SITE_OTHER): Payer: BLUE CROSS/BLUE SHIELD | Admitting: Medical

## 2015-07-30 ENCOUNTER — Encounter: Payer: Self-pay | Admitting: Medical

## 2015-07-30 VITALS — BP 110/80 | HR 71 | Ht 70.0 in | Wt 230.0 lb

## 2015-07-30 DIAGNOSIS — E669 Obesity, unspecified: Secondary | ICD-10-CM

## 2015-07-30 DIAGNOSIS — G8929 Other chronic pain: Secondary | ICD-10-CM

## 2015-07-30 DIAGNOSIS — M545 Low back pain, unspecified: Secondary | ICD-10-CM

## 2015-07-30 DIAGNOSIS — Z0000 Encounter for general adult medical examination without abnormal findings: Secondary | ICD-10-CM

## 2015-07-30 DIAGNOSIS — R7301 Impaired fasting glucose: Secondary | ICD-10-CM

## 2015-07-30 DIAGNOSIS — Z8042 Family history of malignant neoplasm of prostate: Secondary | ICD-10-CM

## 2015-07-30 DIAGNOSIS — E038 Other specified hypothyroidism: Secondary | ICD-10-CM

## 2015-07-30 DIAGNOSIS — R079 Chest pain, unspecified: Secondary | ICD-10-CM | POA: Diagnosis not present

## 2015-07-30 DIAGNOSIS — Z125 Encounter for screening for malignant neoplasm of prostate: Secondary | ICD-10-CM | POA: Diagnosis not present

## 2015-07-30 LAB — POCT URINALYSIS DIPSTICK
BILIRUBIN UA: NEGATIVE
GLUCOSE UA: NEGATIVE
KETONES UA: NEGATIVE
Leukocytes, UA: NEGATIVE
Nitrite, UA: NEGATIVE
Protein, UA: NEGATIVE
RBC UA: NEGATIVE
Urobilinogen, UA: NEGATIVE
pH, UA: 6

## 2015-07-30 LAB — CBC
HCT: 40.5 % (ref 38.5–50.0)
HEMOGLOBIN: 13.2 g/dL (ref 13.2–17.1)
MCH: 28.5 pg (ref 27.0–33.0)
MCHC: 32.6 g/dL (ref 32.0–36.0)
MCV: 87.5 fL (ref 80.0–100.0)
MPV: 10.5 fL (ref 7.5–12.5)
PLATELETS: 250 10*3/uL (ref 140–400)
RBC: 4.63 MIL/uL (ref 4.20–5.80)
RDW: 13.8 % (ref 11.0–15.0)
WBC: 4.3 10*3/uL (ref 4.0–10.5)

## 2015-07-30 LAB — TSH: TSH: 12.96 m[IU]/L — AB (ref 0.40–4.50)

## 2015-07-30 LAB — T4, FREE: Free T4: 0.8 ng/dL (ref 0.8–1.8)

## 2015-07-30 NOTE — Patient Instructions (Signed)
Encounter Diagnoses  Name Primary?  . Encounter for health maintenance examination in adult Yes  . Screening for prostate cancer   . Family history of prostate cancer in father   . Other specified hypothyroidism   . Impaired fasting blood sugar   . Obesity   . Chronic low back pain   . Chest pain, unspecified chest pain type    Recommendations:  See your eye doctor yearly for routine vision care.  See your dentist yearly for routine dental care including hygiene visits twice yearly.  I recommend a yearly flu shot, typically in September  We are doing baseline prostate cancer screen today  I recommend you try and lose weight.  Try and cut out 500 calories daily, exercise daily  Chronic low back pain - I recommend daily stretching routine, regular exercise, and recommend you use ab crunches and back extensions to help tone and strengthen the back  athletes foot -begin back on OTC Lamisil cream daily at bedtime  We will call with lab results

## 2015-07-30 NOTE — Progress Notes (Signed)
Subjective:   HPI  Troy Alvarado is a 40 y.o. male who presents for a complete physical.  New patient today. Was primarily using ED prior.    Concerns: Athlete's foot, otherwise no concerns, no medication for the feet, longstanding.   Not taking thyroid medication.    Gets occasional brief chest pain, not necessarily associated with activity.  Attributes it to stress.  Is active on the job in warehouse.    Reviewed their medical, surgical, family, social, medication, and allergy history and updated chart as appropriate.  Past Medical History  Diagnosis Date  . History of hyperthyroidism 2009    prior oral radioablation therapy  . Obesity   . Hypothyroidism     s/p ablation  . Family history of prostate cancer   . Impaired fasting blood sugar   . Obesity   . Chronic low back pain     Past Surgical History  Procedure Laterality Date  . No past surgeries  07/2015    Social History   Social History  . Marital Status: Married    Spouse Name: N/A  . Number of Children: N/A  . Years of Education: N/A   Occupational History  . warehouse work/distribution [employer]   Social History Main Topics  . Smoking status: Never Smoker   . Smokeless tobacco: Not on file  . Alcohol Use: 4.8 oz/week    5 Cans of beer, 3 Shots of liquor per week  . Drug Use: Yes    Special: Marijuana  . Sexual Activity: Not on file   Other Topics Concern  . Not on file   Social History Narrative   Married, 2 children, 2 girls.    Walks for exercise on the job.  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     Family History  Problem Relation Age of Onset  . Cirrhosis Mother     died of cirrhosis  . Alcohol abuse Mother   . Crohn's disease Mother   . Stroke Mother   . Hypertension Father   . Cancer Father     [id number]  . Diabetes Brother   . Heart disease Neg Hx     No current outpatient prescriptions on file.  No Known Allergies    Review of Systems Constitutional: -fever,  -chills, -sweats, -unexpected weight change, -decreased appetite, -fatigue Allergy: -sneezing, -itching, -congestion Dermatology: -changing moles, --rash, -lumps ENT: -runny nose, -ear pain, -sore throat, -hoarseness, -sinus pain, -teeth pain, - ringing in ears, -hearing loss, -nosebleeds Cardiology: +chest pain, -palpitations, -swelling, -difficulty breathing when lying flat, -waking up short of breath Respiratory: -cough, -shortness of breath, -difficulty breathing with exercise or exertion, -wheezing, -coughing up blood Gastroenterology: -abdominal pain, -nausea, -vomiting, -diarrhea, -constipation, -blood in stool, -changes in bowel movement, -difficulty swallowing or eating Hematology: -bleeding, -bruising  Musculoskeletal: -joint aches, -muscle aches, -joint swelling, +back pain, -neck pain, -cramping, -changes in gait Ophthalmology: denies vision changes, eye redness, itching, discharge Urology: -burning with urination, -difficulty urinating, -blood in urine, -urinary frequency, -urgency, -incontinence Neurology: -headache, -weakness, -tingling, -numbness, -memory loss, -falls, -dizziness Psychology: -depressed mood, -agitation, -sleep problems     Objective:   Physical Exam  BP 110/80 mmHg  Pulse 71  Ht 5\' 10"  (1.778 m)  Wt 230 lb (104.327 kg)  BMI 33.00 kg/m2  General appearance: alert, no distress, WD/WN, AA male Skin: tattoos bilat forearms and upper arms, few scattered macules, no worrisome lesions.  Brown round patches, flat of right lower lateral leg and  left lower medial leg, approx 4cm diameter, etiology unclear, but chronic x 2 years,  He has maceration and white coloration between toes of both feet suggestive of tinea pedis HEENT: normocephalic, conjunctiva/corneas normal, sclerae anicteric, PERRLA, EOMi, nares patent, no discharge or erythema, pharynx normal Oral cavity: MMM, tongue normal, teeth in good repair Neck: supple, no lymphadenopathy, no thyromegaly, no  masses, normal ROM, no bruits Chest: non tender, normal shape and expansion Heart: RRR, normal S1, S2, no murmurs Lungs: CTA bilaterally, no wheezes, rhonchi, or rales Abdomen: +bs, soft, non tender, non distended, no masses, no hepatomegaly, no splenomegaly, no bruits Back: non tender, normal ROM, no scoliosis Musculoskeletal: upper extremities non tender, no obvious deformity, normal ROM throughout, lower extremities non tender, no obvious deformity, normal ROM throughout Extremities: no edema, no cyanosis, no clubbing Pulses: 2+ symmetric, upper and lower extremities, normal cap refill Neurological: alert, oriented x 3, CN2-12 intact, strength normal upper extremities and lower extremities, sensation normal throughout, DTRs 2+ throughout, no cerebellar signs, gait normal Psychiatric: normal affect, behavior normal, pleasant  GU: normal male external genitalia, circumcised, nontender, no masses, no hernia, no lymphadenopathy Rectal:anus normal tone, prostate WNL, no nodules     Adult ECG Report  Indication: chest pain  Rate: 60 bpm  Rhythm: normal sinus rhythm  QRS Axis: -13 degrees  PR Interval: 198ms  QRS Duration: 88ms  QTc: 410ms  Conduction Disturbances: none  Other Abnormalities: none  Patient's cardiac risk factors are: male gender and obesity (BMI >= 30 kg/m2).  EKG comparison: 09/2013  Narrative Interpretation: T wave inversion III, unchanged    Assessment and Plan :    Encounter Diagnoses  Name Primary?  . Encounter for health maintenance examination in adult Yes  . Screening for prostate cancer   . Family history of prostate cancer in father   . Other specified hypothyroidism   . Impaired fasting blood sugar   . Obesity   . Chronic low back pain   . Chest pain, unspecified chest pain type     Physical exam - discussed healthy lifestyle, diet, exercise, preventative care, vaccinations, and addressed their concerns.   See your eye doctor yearly for routine  vision care. See your dentist yearly for routine dental care including hygiene visits twice yearly. Advised yearly flu shot PSA baseline today EKG reviewed, discussed his recent nonspecific chest pain. F/up ending labs  Obesity - advised weight loss through heathy diet, exercise impaired glucose - labs today Chronic low back pain - advised routine stretching and core strengthening  Tinea pedis - begin Lamisil cream nightly OTC Skin coloration of legs - possible venous insufficiency, consider biopsy or ABIs. Follow-up pending labs

## 2015-07-31 LAB — LIPID PANEL
CHOL/HDL RATIO: 2.7 ratio (ref <=5.0)
CHOLESTEROL: 199 mg/dL (ref 125–200)
HDL: 73 mg/dL (ref >=40)
LDL Cholesterol: 115 mg/dL (ref <130)
Triglycerides: 55 mg/dL (ref <150)
VLDL: 11 mg/dL (ref <30)

## 2015-07-31 LAB — COMPREHENSIVE METABOLIC PANEL
ALT: 22 U/L (ref 9–46)
AST: 20 U/L (ref 10–40)
Albumin: 4.5 g/dL (ref 3.6–5.1)
Alkaline Phosphatase: 64 U/L (ref 40–115)
BUN: 14 mg/dL (ref 7–25)
CALCIUM: 9.1 mg/dL (ref 8.6–10.3)
CHLORIDE: 103 mmol/L (ref 98–110)
CO2: 24 mmol/L (ref 20–31)
CREATININE: 0.96 mg/dL (ref 0.60–1.35)
Glucose, Bld: 84 mg/dL (ref 65–99)
Potassium: 4 mmol/L (ref 3.5–5.3)
Sodium: 138 mmol/L (ref 135–146)
TOTAL PROTEIN: 7.3 g/dL (ref 6.1–8.1)
Total Bilirubin: 0.6 mg/dL (ref 0.2–1.2)

## 2015-07-31 LAB — PSA: PSA: 0.56 ng/mL (ref <=4.00)

## 2015-07-31 LAB — HEMOGLOBIN A1C
HEMOGLOBIN A1C: 5.6 % (ref <5.7)
MEAN PLASMA GLUCOSE: 114 mg/dL

## 2015-08-01 ENCOUNTER — Other Ambulatory Visit: Payer: Self-pay | Admitting: Medical

## 2015-08-01 MED ORDER — SYNTHROID 50 MCG PO TABS: 50.0000 ug | ORAL_TABLET | Freq: Every day | ORAL | Status: DC

## 2015-08-09 ENCOUNTER — Emergency Department (HOSPITAL_COMMUNITY)
Admission: EM | Admit: 2015-08-09 | Discharge: 2015-08-09 | Disposition: A | Discharge status: Home / Self Care | Payer: Self-pay | Attending: Dermatology | Admitting: Dermatology

## 2015-08-09 ENCOUNTER — Encounter (HOSPITAL_COMMUNITY): Payer: Self-pay | Admitting: Emergency Medicine

## 2015-08-09 DIAGNOSIS — R109 Unspecified abdominal pain: Secondary | ICD-10-CM | POA: Insufficient documentation

## 2015-08-09 DIAGNOSIS — Z5321 Procedure and treatment not carried out due to patient leaving prior to being seen by health care provider: Secondary | ICD-10-CM | POA: Insufficient documentation

## 2015-08-09 LAB — COMPREHENSIVE METABOLIC PANEL
ALT: 18 U/L (ref 17–63)
AST: 20 U/L (ref 15–41)
Albumin: 4.5 g/dL (ref 3.5–5.0)
Alkaline Phosphatase: 60 U/L (ref 38–126)
Anion gap: 6 (ref 5–15)
BUN: 10 mg/dL (ref 6–20)
CHLORIDE: 106 mmol/L (ref 101–111)
CO2: 26 mmol/L (ref 22–32)
CREATININE: 1.14 mg/dL (ref 0.61–1.24)
Calcium: 9.4 mg/dL (ref 8.9–10.3)
GFR calc non Af Amer: >60 mL/min (ref >60)
Glucose, Bld: 100 mg/dL — ABNORMAL HIGH (ref 65–99)
Potassium: 3.7 mmol/L (ref 3.5–5.1)
SODIUM: 138 mmol/L (ref 135–145)
Total Bilirubin: 0.6 mg/dL (ref 0.3–1.2)
Total Protein: 8.1 g/dL (ref 6.5–8.1)

## 2015-08-09 LAB — URINALYSIS, ROUTINE W REFLEX MICROSCOPIC
Bilirubin Urine: NEGATIVE
GLUCOSE, UA: NEGATIVE mg/dL
HGB URINE DIPSTICK: NEGATIVE
Ketones, ur: 15 mg/dL — AB
Leukocytes, UA: NEGATIVE
Nitrite: NEGATIVE
PH: 6 (ref 5.0–8.0)
PROTEIN: 30 mg/dL — AB
Specific Gravity, Urine: 1.035 — ABNORMAL HIGH (ref 1.005–1.030)

## 2015-08-09 LAB — CBC
HEMATOCRIT: 41.4 % (ref 39.0–52.0)
Hemoglobin: 13.8 g/dL (ref 13.0–17.0)
MCH: 28.8 pg (ref 26.0–34.0)
MCHC: 33.3 g/dL (ref 30.0–36.0)
MCV: 86.3 fL (ref 78.0–100.0)
PLATELETS: 247 10*3/uL (ref 150–400)
RBC: 4.8 MIL/uL (ref 4.22–5.81)
RDW: 13.5 % (ref 11.5–15.5)
WBC: 4.9 10*3/uL (ref 4.0–10.5)

## 2015-08-09 LAB — URINE MICROSCOPIC-ADD ON: RBC / HPF: NONE SEEN RBC/hpf (ref 0–5)

## 2015-08-09 LAB — LIPASE, BLOOD: LIPASE: 18 U/L (ref 11–51)

## 2015-08-09 NOTE — ED Notes (Signed)
Pt reports having n/v/d since Sunday morning. Pt reports eating seafood at [employer] saturday night. Pt has mid abd pain at this time 2/10. Pt reports 10 episodes of diarrhea today.

## 2015-08-09 NOTE — ED Notes (Signed)
Pt. Left AMA  

## 2015-09-10 ENCOUNTER — Encounter: Payer: Self-pay | Admitting: Medical

## 2015-09-10 ENCOUNTER — Ambulatory Visit (INDEPENDENT_AMBULATORY_CARE_PROVIDER_SITE_OTHER): Payer: BLUE CROSS/BLUE SHIELD | Admitting: Medical

## 2015-09-10 VITALS — BP 130/92 | HR 79 | Wt 236.0 lb

## 2015-09-10 DIAGNOSIS — L989 Disorder of the skin and subcutaneous tissue, unspecified: Secondary | ICD-10-CM | POA: Insufficient documentation

## 2015-09-10 DIAGNOSIS — R030 Elevated blood-pressure reading, without diagnosis of hypertension: Secondary | ICD-10-CM | POA: Insufficient documentation

## 2015-09-10 DIAGNOSIS — R0989 Other specified symptoms and signs involving the circulatory and respiratory systems: Secondary | ICD-10-CM

## 2015-09-10 DIAGNOSIS — R7301 Impaired fasting glucose: Secondary | ICD-10-CM

## 2015-09-10 DIAGNOSIS — R809 Proteinuria, unspecified: Secondary | ICD-10-CM | POA: Diagnosis not present

## 2015-09-10 DIAGNOSIS — E038 Other specified hypothyroidism: Secondary | ICD-10-CM

## 2015-09-10 DIAGNOSIS — E669 Obesity, unspecified: Secondary | ICD-10-CM | POA: Diagnosis not present

## 2015-09-10 LAB — T4, FREE: Free T4: 1 ng/dL (ref 0.8–1.8)

## 2015-09-10 LAB — POCT URINALYSIS DIPSTICK
Bilirubin, UA: NEGATIVE
Blood, UA: NEGATIVE
Glucose, UA: NEGATIVE
KETONES UA: NEGATIVE
LEUKOCYTES UA: NEGATIVE
NITRITE UA: NEGATIVE
PH UA: 6
PROTEIN UA: NEGATIVE
Spec Grav, UA: >=1.03
Urobilinogen, UA: NEGATIVE

## 2015-09-10 LAB — TSH: TSH: 7.66 m[IU]/L — AB (ref 0.40–4.50)

## 2015-09-10 NOTE — Patient Instructions (Addendum)
Your pulses in your legs are decreased You have hypothyroidism/underactive thyroid   Recommendations:  I recommend we refer you for blood pressures in legs called ABIs to screen for blockages in the legs  I recommend you check blood pressure readings at the pharmacy, write these down, and get me readings in a few weeks  Normal is 120/80  If you BPs continue to run high, then we will need to start medication  We are rechecking thyroid labs  If desired I can have you return for biopsy of the leg or we can refer to dermatology regarding the leg lesion    Hypertension Hypertension, commonly called high blood pressure, is when the force of blood pumping through your arteries is too strong. Your arteries are the blood vessels that carry blood from your heart throughout your body. A blood pressure reading consists of a higher number over a lower number, such as 110/72. The higher number (systolic) is the pressure inside your arteries when your heart pumps. The lower number (diastolic) is the pressure inside your arteries when your heart relaxes. Ideally you want your blood pressure below 120/80. Hypertension forces your heart to work harder to pump blood. Your arteries may become narrow or stiff. Having untreated or uncontrolled hypertension can cause heart attack, stroke, kidney disease, and other problems. RISK FACTORS Some risk factors for high blood pressure are controllable. Others are not.  Risk factors you cannot control include:  Race. You may be at higher risk if you are African American. Age. Risk increases with age. Gender. Men are at higher risk than women before age 84 years. After age 49, women are at higher risk than men. Risk factors you can control include: Not getting enough exercise or physical activity. Being overweight. Getting too much fat, sugar, calories, or salt in your diet. Drinking too much alcohol. SIGNS AND SYMPTOMS Hypertension does not usually cause signs  or symptoms. Extremely high blood pressure (hypertensive crisis) may cause headache, anxiety, shortness of breath, and nosebleed. DIAGNOSIS To check if you have hypertension, your health care provider will measure your blood pressure while you are seated, with your arm held at the level of your heart. It should be measured at least twice using the same arm. Certain conditions can cause a difference in blood pressure between your right and left arms. A blood pressure reading that is higher than normal on one occasion does not mean that you need treatment. If it is not clear whether you have high blood pressure, you may be asked to return on a different day to have your blood pressure checked again. Or, you may be asked to monitor your blood pressure at home for 1 or more weeks. TREATMENT Treating high blood pressure includes making lifestyle changes and possibly taking medicine. Living a healthy lifestyle can help lower high blood pressure. You may need to change some of your habits. Lifestyle changes may include: Following the DASH diet. This diet is high in fruits, vegetables, and whole grains. It is low in salt, red meat, and added sugars. Keep your sodium intake below 2,300 mg per day. Getting at least 30-45 minutes of aerobic exercise at least 4 times per week. Losing weight if necessary. Not smoking. Limiting alcoholic beverages. Learning ways to reduce stress. Your health care provider may prescribe medicine if lifestyle changes are not enough to get your blood pressure under control, and if one of the following is true: You are 91-67 years of age and your systolic blood pressure  is above 140. You are 40 years of age or older, and your systolic blood pressure is above 150. Your diastolic blood pressure is above 90. You have diabetes, and your systolic blood pressure is over 140 or your diastolic blood pressure is over 90. You have kidney disease and your blood pressure is above 140/90. You  have heart disease and your blood pressure is above 140/90. Your personal target blood pressure may vary depending on your medical conditions, your age, and other factors. HOME CARE INSTRUCTIONS Have your blood pressure rechecked as directed by your health care provider.  Take medicines only as directed by your health care provider. Follow the directions carefully. Blood pressure medicines must be taken as prescribed. The medicine does not work as well when you skip doses. Skipping doses also puts you at risk for problems. Do not smoke.  Monitor your blood pressure at home as directed by your health care provider. SEEK MEDICAL CARE IF:  You think you are having a reaction to medicines taken. You have recurrent headaches or feel dizzy. You have swelling in your ankles. You have trouble with your vision. SEEK IMMEDIATE MEDICAL CARE IF: You develop a severe headache or confusion. You have unusual weakness, numbness, or feel faint. You have severe chest or abdominal pain. You vomit repeatedly. You have trouble breathing. MAKE SURE YOU:  Understand these instructions. Will watch your condition. Will get help right away if you are not doing well or get worse.   This information is not intended to replace advice given to you by your health care provider. Make sure you discuss any questions you have with your health care provider.   Document Released: 01/09/2005 Document Revised: 05/26/2014 Document Reviewed: 11/01/2012 Elsevier Interactive Patient Education 2016 [employer]      Hypothyroidism Hypothyroidism is a disorder of the thyroid. The thyroid is a large gland that is located in the lower front of the neck. The thyroid releases hormones that control how the body works. With hypothyroidism, the thyroid does not make enough of these hormones. CAUSES Causes of hypothyroidism may include:  Viral infections.  Pregnancy.  Your own defense system (immune system) attacking your  thyroid.  Certain medicines.  Birth defects.  Past radiation treatments to your head or neck.  Past treatment with radioactive iodine.  Past surgical removal of part or all of your thyroid.  Problems with the gland that is located in the center of your brain (pituitary). SIGNS AND SYMPTOMS Signs and symptoms of hypothyroidism may include:  Feeling as though you have no energy (lethargy).  Inability to tolerate cold.  Weight gain that is not explained by a change in diet or exercise habits.  Dry skin.  Coarse hair.  Menstrual irregularity.  Slowing of thought processes.  Constipation.  Sadness or depression. DIAGNOSIS  Your health care provider may diagnose hypothyroidism with blood tests and ultrasound tests. TREATMENT Hypothyroidism is treated with medicine that replaces the hormones that your body does not make. After you begin treatment, it may take several weeks for symptoms to go away. HOME CARE INSTRUCTIONS   Take medicines only as directed by your health care provider.  If you start taking any new medicines, tell your health care provider.  Keep all follow-up visits as directed by your health care provider. This is important. As your condition improves, your dosage needs may change. You will need to have blood tests regularly so that your health care provider can watch your condition. SEEK MEDICAL CARE IF:  Your symptoms do not get better with treatment.  You are taking thyroid replacement medicine and:  You sweat excessively.  You have tremors.  You feel anxious.  You lose weight rapidly.  You cannot tolerate heat.  You have emotional swings.  You have diarrhea.  You feel weak. SEEK IMMEDIATE MEDICAL CARE IF:   You develop chest pain.  You develop an irregular heartbeat.  You develop a rapid heartbeat.   This information is not intended to replace advice given to you by your health care provider. Make sure you discuss any questions you  have with your health care provider.   Document Released: 01/09/2005 Document Revised: 01/30/2014 Document Reviewed: 05/27/2013 Elsevier Interactive Patient Education [employer] Elsevier Inc.

## 2015-09-10 NOTE — Progress Notes (Signed)
Subjective: Chief Complaint  Patient presents with  . Follow-up    medication is going well. getting refilled today. needs leg pulses rechecked.    Here for f/u from physical and labs on 08/09/15.  At that time thyroid medication was begun given abnormal thyroid findings. He is compliant with that medication without c/o.  He does note some fatigue,but no palpations, headaches.   He is exercising some.  Here to recheck leg lesions unchanged for 1+ year, but not sure what they are.  Uses hydrocortisone sometimes which doesn't help.   Here for f/u on proteinuria.  No other aggravating or relieving factors. No other complaint.  Past Medical History:  Diagnosis Date  . Chronic low back pain   . Family history of prostate cancer   . History of hyperthyroidism 2009   prior oral radioablation therapy  . Hypothyroidism    s/p ablation  . Impaired fasting blood sugar   . Obesity   . Obesity    Past Surgical History:  Procedure Laterality Date  . NO PAST SURGERIES  07/2015   Social History   Social History  . Marital status: Married    Spouse name: N/A  . Number of children: N/A  . Years of education: N/A   Occupational History  . warehouse work/distribution [employer]   Social History Main Topics  . Smoking status: Never Smoker  . Smokeless tobacco: Never Used  . Alcohol use 4.8 oz/week    5 Cans of beer, 3 Shots of liquor per week  . Drug use:     Types: Marijuana  . Sexual activity: Not on file   Other Topics Concern  . Not on file   Social History Narrative   Married, 2 children, 2 girls.    Walks for exercise on the job.  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      ROS as in subjective    Objective: BP (!) 130/92   Pulse 79   Wt 236 lb (107 kg)   BMI 32.92 kg/m   BP Readings from Last 3 Encounters:  09/10/15 (!) 130/92  08/09/15 134/99  07/30/15 110/80   Wt Readings from Last 3 Encounters:  09/10/15 236 lb (107 kg)  08/09/15 230 lb (104.3  kg)  07/30/15 230 lb (104.3 kg)    Gen: wd, wn, nad, AA male Skin: Brown round patches, flat of right lower lateral leg and left lower medial leg, approximately 4cm diameter, etiology unclear, but chronic x 2 years Heart: RRR, normal S1, S2, no murmurs Lungs: CTA bilaterally, no wheezes, rhonchi, or rales Extremities: no edema, no cyanosis, no clubbing Pulses: 1+ pedal pulses normal cap refill Legs with normal strength, sensation, DTRs   Assessment: Encounter Diagnoses  Name Primary?  . Other specified hypothyroidism Yes  . Elevated blood-pressure reading without diagnosis of hypertension   . Impaired fasting blood sugar   . Obesity   . Decreased pulses in feet   . Skin lesion   . Proteinuria     Plan: Offered referral for ABIs legs.  He will consider offered biopsy of leg lesions.  He will consider. Recheck thyroid labs today, he is taking 50mcg daily synthroid advised of new findings of elevated BPs.   He is reluctant to start medication.  discussed diet, exercise, limiting salt, exercising, losing weight.   Troy Alvarado was seen today for follow-up.  Diagnoses and all orders for this visit:  Other specified hypothyroidism  Elevated blood-pressure reading without diagnosis  of hypertension  Impaired fasting blood sugar  Obesity  Decreased pulses in feet  Skin lesion  Proteinuria

## 2015-09-13 ENCOUNTER — Other Ambulatory Visit: Payer: Self-pay

## 2015-09-13 MED ORDER — LEVOTHYROXINE SODIUM 88 MCG PO TABS: 88.0000 ug | ORAL_TABLET | Freq: Every day | ORAL | 2 refills | Status: DC

## 2015-10-08 ENCOUNTER — Other Ambulatory Visit: Payer: Self-pay | Admitting: Medical

## 2015-10-29 ENCOUNTER — Ambulatory Visit: Payer: Self-pay | Admitting: Medical

## 2016-02-21 ENCOUNTER — Emergency Department (HOSPITAL_COMMUNITY)
Admission: EM | Admit: 2016-02-21 | Discharge: 2016-02-21 | Disposition: A | Discharge status: Home / Self Care | Payer: BLUE CROSS/BLUE SHIELD | Attending: Emergency Medicine | Admitting: Emergency Medicine

## 2016-02-21 ENCOUNTER — Encounter (HOSPITAL_COMMUNITY): Payer: Self-pay | Admitting: Neurology

## 2016-02-21 DIAGNOSIS — J029 Acute pharyngitis, unspecified: Secondary | ICD-10-CM | POA: Diagnosis not present

## 2016-02-21 DIAGNOSIS — R05 Cough: Secondary | ICD-10-CM | POA: Diagnosis not present

## 2016-02-21 DIAGNOSIS — M791 Myalgia: Secondary | ICD-10-CM | POA: Diagnosis not present

## 2016-02-21 DIAGNOSIS — E039 Hypothyroidism, unspecified: Secondary | ICD-10-CM | POA: Diagnosis not present

## 2016-02-21 DIAGNOSIS — R6889 Other general symptoms and signs: Secondary | ICD-10-CM

## 2016-02-21 MED ORDER — OSELTAMIVIR PHOSPHATE 75 MG PO CAPS: 75.0000 mg | ORAL_CAPSULE | Freq: Two times a day (BID) | ORAL | 0 refills | Status: DC

## 2016-02-21 NOTE — ED Triage Notes (Signed)
Pt reports cough, body aches, sore throat since yesterday.

## 2016-02-21 NOTE — ED Provider Notes (Signed)
[facility]-EMERGENCY DEPT Provider Note   CSN: [id number] Arrival date & time: 02/21/16  1416  By signing my name below, I, Freida Busman, attest that this documentation has been prepared under the direction and in the presence of Raeford Razor, MD . Electronically Signed: Freida Busman, Scribe. 02/21/2016. 3:49 PM.  History   Chief Complaint Chief Complaint  Patient presents with  . Generalized Body Aches  . Cough     The history is provided by the patient. No language interpreter was used.     HPI Comments:  Troy Alvarado is a 41 y.o. male who presents to the Emergency Department complaining of a persistent cough x 2 days. Pt reports associated sore throat, chills, and generalized body aches. He also notes 2 episodes of loose stools. He has taken motrin with mild relief. His son was recently sick with a fever.  Pt denies fever, abdominal pain, and nausea.   Past Medical History:  Diagnosis Date  . Chronic low back pain   . Family history of prostate cancer   . History of hyperthyroidism 2009   prior oral radioablation therapy  . Hypothyroidism    s/p ablation  . Impaired fasting blood sugar   . Obesity   . Obesity     Patient Active Problem List   Diagnosis Date Noted  . Elevated blood-pressure reading without diagnosis of hypertension 09/10/2015  . Decreased pulses in feet 09/10/2015  . Skin lesion 09/10/2015  . Proteinuria 09/10/2015  . Encounter for health maintenance examination in adult 07/30/2015  . Screening for prostate cancer 07/30/2015  . Family history of prostate cancer in father 07/30/2015  . Other specified hypothyroidism 07/30/2015  . Impaired fasting blood sugar 07/30/2015  . Obesity 07/30/2015  . Chronic low back pain 07/30/2015  . PARONYCHIA 12/01/2005    Past Surgical History:  Procedure Laterality Date  . NO PAST SURGERIES  07/2015       Home Medications    Prior to Admission medications   Medication Sig Start Date End Date Taking?  Authorizing Provider  levothyroxine (SYNTHROID) 88 MCG tablet Take 1 tablet (88 mcg total) by mouth daily before breakfast. 09/13/15   Jac Canavan, PA-C  SYNTHROID 50 MCG tablet TAKE 1 TABLET BY MOUTH DAILY BEFORE BREAKFAST 10/08/15   Jac Canavan, PA-C    Family History Family History  Problem Relation Age of Onset  . Cirrhosis Mother     died of cirrhosis  . Alcohol abuse Mother   . Crohn's disease Mother   . Stroke Mother   . Hypertension Father   . Cancer Father     51  . Diabetes Brother   . Heart disease Neg Hx     Social History Social History  Substance Use Topics  . Smoking status: Never Smoker  . Smokeless tobacco: Never Used  . Alcohol use 4.8 oz/week    5 Cans of beer, 3 Shots of liquor per week     Allergies   Patient has no known allergies.   Review of Systems Review of Systems  Constitutional: Positive for chills. Negative for fever.  HENT: Positive for sore throat.   Respiratory: Positive for cough.   Gastrointestinal: Positive for diarrhea. Negative for abdominal pain, nausea and vomiting.  Musculoskeletal: Positive for myalgias.     Physical Exam Updated Vital Signs BP 139/92 (BP Location: Left Arm)   Pulse 88   Temp 99.3 F (37.4 C) (Oral)   Resp 16   Ht 5\' 11"  (1.803 m)  Wt 230 lb (104.3 kg)   SpO2 97%   BMI 32.08 kg/m   Physical Exam  Constitutional: He is oriented to person, place, and time. He appears well-developed and well-nourished.  Tired but non-toxic appearing   HENT:  Head: Normocephalic and atraumatic.  Mouth/Throat: Oropharynx is clear and moist.  Eyes: EOM are normal.  Neck: Normal range of motion.  Cardiovascular: Normal rate, regular rhythm, normal heart sounds and intact distal pulses.   Pulmonary/Chest: Effort normal and breath sounds normal. No respiratory distress.  Abdominal: Soft. He exhibits no distension. There is no tenderness.  Musculoskeletal: Normal range of motion.  Neurological: He is alert  and oriented to person, place, and time.  Skin: Skin is warm and dry.  Psychiatric: He has a normal mood and affect. Judgment normal.  Nursing note and vitals reviewed.    ED Treatments / Results  DIAGNOSTIC STUDIES:  Oxygen Saturation is 97% on RA, normal by my interpretation.    COORDINATION OF CARE:  3:46 PM Discussed treatment plan with pt at bedside and pt agreed to plan.  Labs (all labs ordered are listed, but only abnormal results are displayed) Labs Reviewed - No data to display  EKG  EKG Interpretation None       Radiology No results found.  Procedures Procedures (including critical care time)  Medications Ordered in ED Medications - No data to display   Initial Impression / Assessment and Plan / ED Course  I have reviewed the triage vital signs and the nursing notes.  Pertinent labs & imaging results that were available during my care of the patient were reviewed by me and considered in my medical decision making (see chart for details).     41 year old male with likely influenza. He is relatively young and does not have any significant underlying medical comorbidities. The emergency room he seemed somewhat tired, but not toxic. He is hemodynamically stable.? Tamiflu since his symptoms started yesterday. Otherwise symptomatic treatment. Plenty at rest. [place] fluids. As needed NSAIDs for fever and aches and pains.  Final Clinical Impressions(s) / ED Diagnoses   Final diagnoses:  Flu-like symptoms    New Prescriptions New Prescriptions   No medications on file    I personally preformed the services scribed in my presence. The recorded information has been reviewed is accurate. Raeford RazorStephen Andros Channing, MD.     Raeford RazorStephen Anyeli Hockenbury, MD 03/01/16 1420

## 2017-01-03 ENCOUNTER — Encounter (HOSPITAL_COMMUNITY): Payer: Self-pay | Admitting: *Deleted

## 2017-01-03 ENCOUNTER — Emergency Department (HOSPITAL_COMMUNITY): Payer: BLUE CROSS/BLUE SHIELD

## 2017-01-03 ENCOUNTER — Other Ambulatory Visit: Payer: Self-pay

## 2017-01-03 ENCOUNTER — Emergency Department (HOSPITAL_COMMUNITY)
Admission: EM | Admit: 2017-01-03 | Discharge: 2017-01-03 | Disposition: A | Discharge status: Home / Self Care | Payer: BLUE CROSS/BLUE SHIELD | Attending: Emergency Medicine | Admitting: Emergency Medicine

## 2017-01-03 DIAGNOSIS — J181 Lobar pneumonia, unspecified organism: Secondary | ICD-10-CM | POA: Diagnosis not present

## 2017-01-03 DIAGNOSIS — J189 Pneumonia, unspecified organism: Secondary | ICD-10-CM

## 2017-01-03 DIAGNOSIS — E039 Hypothyroidism, unspecified: Secondary | ICD-10-CM | POA: Insufficient documentation

## 2017-01-03 DIAGNOSIS — Z79899 Other long term (current) drug therapy: Secondary | ICD-10-CM | POA: Diagnosis not present

## 2017-01-03 DIAGNOSIS — R05 Cough: Secondary | ICD-10-CM | POA: Diagnosis present

## 2017-01-03 DIAGNOSIS — R69 Illness, unspecified: Secondary | ICD-10-CM

## 2017-01-03 DIAGNOSIS — J111 Influenza due to unidentified influenza virus with other respiratory manifestations: Secondary | ICD-10-CM

## 2017-01-03 DIAGNOSIS — R059 Cough, unspecified: Secondary | ICD-10-CM

## 2017-01-03 MED ORDER — IBUPROFEN 200 MG PO TABS
600.0000 mg | ORAL_TABLET | Freq: Once | ORAL | Status: AC
  Administered 2017-01-03: 600 mg via ORAL
  Filled 2017-01-03: qty 1

## 2017-01-03 MED ORDER — AZITHROMYCIN 250 MG PO TABS: 250.0000 mg | ORAL_TABLET | Freq: Every day | ORAL | 0 refills | Status: DC

## 2017-01-03 NOTE — Discharge Instructions (Signed)
Please complete full course of antibiotics as directed, even if symptoms have completely improved. You may use ibuprofen and Tylenol for pain and fever, and use over-the-counter medications such as delsym to support your symptoms. Drink plenty of fluids. Please use the phone number provided to call and establish care with a primary care provider to follow up and ensure your pneumonia resolves. If any of the below scenarios develop, or other concerning symptoms occur return to the ED for suture reevaluation.  Get help right away if: You are short of breath and it gets worse. You have more chest pain. Your sickness gets worse. This is very serious if: You are an older adult. Your body's defense system is weak. You cough up blood.

## 2017-01-03 NOTE — ED Provider Notes (Signed)
[facility] EMERGENCY DEPARTMENT Provider Note   CSN: [id number] Arrival date & time: 01/03/17  7829     History   Chief Complaint Chief Complaint  Patient presents with  . Cough    HPI  Troy Alvarado is a 41 y.o. Male with a history of hypothyroidism status post ablation, and chronic low back pain, presents complaining of 3 days of productive cough, chills and generalized body aches. Patient reports some mild associated rhinorrhea and nasal congestion, denies sore throat or ear pain. Patient reports chest soreness from coughing, denies shortness of breath. No nausea, vomiting diarrhea or abdominal pain. Patient has been taking Alka-Seltzer cold and sinus home with minimal improvement in his symptoms. Patient denies any other medications at home to treat symptoms. Has not had his flu shot this year. No one else in the home is sick, no known sick contacts.       Past Medical History:  Diagnosis Date  . Chronic low back pain   . Family history of prostate cancer   . History of hyperthyroidism 2009   prior oral radioablation therapy  . Hypothyroidism    s/p ablation  . Impaired fasting blood sugar   . Obesity   . Obesity     Patient Active Problem List   Diagnosis Date Noted  . Elevated blood-pressure reading without diagnosis of hypertension 09/10/2015  . Decreased pulses in feet 09/10/2015  . Skin lesion 09/10/2015  . Proteinuria 09/10/2015  . Encounter for health maintenance examination in adult 07/30/2015  . Screening for prostate cancer 07/30/2015  . Family history of prostate cancer in father 07/30/2015  . Other specified hypothyroidism 07/30/2015  . Impaired fasting blood sugar 07/30/2015  . Obesity 07/30/2015  . Chronic low back pain 07/30/2015  . PARONYCHIA 12/01/2005    Past Surgical History:  Procedure Laterality Date  . NO PAST SURGERIES  07/2015       Home Medications    Prior to Admission medications   Medication Sig Start  Date End Date Taking? Authorizing Provider  levothyroxine (SYNTHROID) 88 MCG tablet Take 1 tablet (88 mcg total) by mouth daily before breakfast. 09/13/15   Tysinger, Kermit Balo, PA-C  oseltamivir (TAMIFLU) 75 MG capsule Take 1 capsule (75 mg total) by mouth every 12 (twelve) hours. 02/21/16   Raeford Razor, MD  SYNTHROID 50 MCG tablet TAKE 1 TABLET BY MOUTH DAILY BEFORE BREAKFAST 10/08/15   Tysinger, Kermit Balo, PA-C    Family History Family History  Problem Relation Age of Onset  . Cirrhosis Mother        died of cirrhosis  . Alcohol abuse Mother   . Crohn's disease Mother   . Stroke Mother   . Hypertension Father   . Cancer Father        44  . Diabetes Brother   . Heart disease Neg Hx     Social History Social History   Tobacco Use  . Smoking status: Never Smoker  . Smokeless tobacco: Never Used  Substance Use Topics  . Alcohol use: Yes    Alcohol/week: 4.8 oz    Types: 5 Cans of beer, 3 Shots of liquor per week  . Drug use: Yes    Types: Marijuana     Allergies   Patient has no known allergies.   Review of Systems Review of Systems  Constitutional: Positive for chills and fatigue. Negative for fever.  HENT: Positive for congestion and rhinorrhea. Negative for ear discharge, ear pain, sinus pressure, sinus  pain, sore throat and trouble swallowing.   Eyes: Negative for discharge, redness and itching.  Respiratory: Positive for cough. Negative for chest tightness and shortness of breath.   Cardiovascular: Positive for chest pain (Soreness from coughing).  Gastrointestinal: Negative for abdominal pain, diarrhea, nausea and vomiting.  Musculoskeletal: Positive for myalgias. Negative for arthralgias, joint swelling and neck pain.  Skin: Negative for rash.     Physical Exam Updated Vital Signs BP (!) 133/99 (BP Location: Right Arm)   Pulse 99   Temp 99.8 F (37.7 C) (Oral)   Resp 18   Ht 5\' 11"  (1.803 m)   Wt 108.9 kg (240 lb)   SpO2 100%   BMI 33.47 kg/m    Physical Exam  Constitutional: He appears well-developed and well-nourished. No distress.  HENT:  Head: Normocephalic and atraumatic.  TMs clear with good landmarks, moderate nasal mucosa edema with clear rhinorrhea, posterior oropharynx clear and moist, with some erythema, no edema or exudates, uvula midline, no trismus  Eyes: Right eye exhibits no discharge. Left eye exhibits no discharge.  Neck: Normal range of motion. Neck supple.  Cardiovascular: Regular rhythm and normal heart sounds.  Pulmonary/Chest: Effort normal and breath sounds normal. No stridor. No respiratory distress. He has no wheezes. He has no rales. He exhibits no tenderness.  Lungs CTA bilaterally with good air movement throughout  Abdominal: Soft. Bowel sounds are normal. He exhibits no distension. There is no tenderness. There is no guarding.  Neurological: He is alert. Coordination normal.  Skin: Skin is warm and dry. Capillary refill takes less than 2 seconds. He is not diaphoretic.  Psychiatric: He has a normal mood and affect. His behavior is normal.  Nursing note and vitals reviewed.    ED Treatments / Results  Labs (all labs ordered are listed, but only abnormal results are displayed) Labs Reviewed - No data to display  EKG  EKG Interpretation None       Radiology Dg Chest 2 View  Result Date: 01/03/2017 CLINICAL DATA:  Chest pain and cough EXAM: CHEST  2 VIEW COMPARISON:  March 12, 2006 FINDINGS: There is atelectatic change in the posterior segment right upper lobe. Lungs elsewhere are clear. Heart size and pulmonary vascularity are normal. No adenopathy. No evident bone lesions. IMPRESSION: Atelectatic change posterior segment right upper lobe, better seen on lateral view. Early pneumonia in this area cannot be excluded. Lungs elsewhere clear. No adenopathy. Electronically Signed   By: Bretta BangWilliam  Woodruff III M.D.   On: 01/03/2017 11:35    Procedures Procedures (including critical care  time)  Medications Ordered in ED Medications  ibuprofen (ADVIL,MOTRIN) tablet 600 mg (600 mg Oral Given 01/03/17 1120)     Initial Impression / Assessment and Plan / ED Course  I have reviewed the triage vital signs and the nursing notes.  Pertinent labs & imaging results that were available during my care of the patient were reviewed by me and considered in my medical decision making (see chart for details).  Patient has been diagnosed with CAP via chest xray, which shows RUL early infiltrate. Pt is not ill appearing, immunocompromised, and does not have multiple co morbidities, therefore I feel like the they can be treated as an OP with abx therapy. Pt has been advised to return to the ED if symptoms worsen or they do not improve. Pt verbalizes understanding and is agreeable with plan.    Final Clinical Impressions(s) / ED Diagnoses   Final diagnoses:  Influenza-like illness  Cough  Community acquired pneumonia of right upper lobe of lung [facility](HCC)    ED Discharge Orders        Ordered    azithromycin (ZITHROMAX) 250 MG tablet  Daily     01/03/17 1147       Dartha LodgeFord, Rayland Hamed N, [place]PA-C 01/04/17 1522    Shaune PollackIsaacs, [place], MD 01/05/17 [phone]

## 2017-01-03 NOTE — ED Triage Notes (Signed)
Pt c/o generalized body aches with cough, pt c/o fatigue, denies ear & throat pain, A&O x4

## 2017-03-10 ENCOUNTER — Emergency Department (HOSPITAL_COMMUNITY)
Admission: EM | Admit: 2017-03-10 | Discharge: 2017-03-10 | Disposition: A | Discharge status: Home / Self Care | Payer: BLUE CROSS/BLUE SHIELD | Attending: Emergency Medicine | Admitting: Emergency Medicine

## 2017-03-10 ENCOUNTER — Other Ambulatory Visit: Payer: Self-pay

## 2017-03-10 ENCOUNTER — Emergency Department (HOSPITAL_COMMUNITY): Payer: BLUE CROSS/BLUE SHIELD

## 2017-03-10 ENCOUNTER — Encounter (HOSPITAL_COMMUNITY): Payer: Self-pay | Admitting: *Deleted

## 2017-03-10 DIAGNOSIS — R079 Chest pain, unspecified: Secondary | ICD-10-CM | POA: Diagnosis present

## 2017-03-10 DIAGNOSIS — Z79899 Other long term (current) drug therapy: Secondary | ICD-10-CM | POA: Diagnosis not present

## 2017-03-10 DIAGNOSIS — E890 Postprocedural hypothyroidism: Secondary | ICD-10-CM | POA: Diagnosis not present

## 2017-03-10 DIAGNOSIS — R0789 Other chest pain: Secondary | ICD-10-CM | POA: Diagnosis not present

## 2017-03-10 LAB — CBC
HCT: 41.5 % (ref 39.0–52.0)
Hemoglobin: 13.9 g/dL (ref 13.0–17.0)
MCH: 29.4 pg (ref 26.0–34.0)
MCHC: 33.5 g/dL (ref 30.0–36.0)
MCV: 87.7 fL (ref 78.0–100.0)
PLATELETS: 245 10*3/uL (ref 150–400)
RBC: 4.73 MIL/uL (ref 4.22–5.81)
RDW: 13.5 % (ref 11.5–15.5)
WBC: 4.5 10*3/uL (ref 4.0–10.5)

## 2017-03-10 LAB — BASIC METABOLIC PANEL
Anion gap: 10 (ref 5–15)
BUN: 17 mg/dL (ref 6–20)
CHLORIDE: 107 mmol/L (ref 101–111)
CO2: 24 mmol/L (ref 22–32)
CREATININE: 1.1 mg/dL (ref 0.61–1.24)
Calcium: 9.2 mg/dL (ref 8.9–10.3)
GFR calc Af Amer: >60 mL/min (ref >60)
GFR calc non Af Amer: >60 mL/min (ref >60)
Glucose, Bld: 111 mg/dL — ABNORMAL HIGH (ref 65–99)
Potassium: 4.7 mmol/L (ref 3.5–5.1)
SODIUM: 141 mmol/L (ref 135–145)

## 2017-03-10 LAB — TROPONIN I
Troponin I: <0.03 ng/mL (ref <0.03)
Troponin I: <0.03 ng/mL (ref <0.03)

## 2017-03-10 NOTE — ED Triage Notes (Signed)
The pt is c/o mid-upper chest pain for 3-4 days and this am he woke up  The same tightness or fullness  No sob no nausea no dizziness at present alert oriented skin warm and dry

## 2017-03-10 NOTE — ED Notes (Signed)
Pt going to xray  

## 2017-03-10 NOTE — ED Provider Notes (Signed)
[facility] EMERGENCY DEPARTMENT Provider Note   CSN: [id number] Arrival date & time: 03/10/17  0631     History   Chief Complaint Chief Complaint  Patient presents with  . Chest Pain    HPI Troy Alvarado is a 41 y.o. male.  HPI   42 year old obese male with history of thyroid disease, chronic low back pain presenting for evaluation of chest pain.  Patient report for the past 3-4 weeks he has had intermittent chest pain.  He described pain as a sharp sensation primarily in his left chest, nonradiating, usually lasting for a few minutes, seems to be brought on by stress.  Pain is not associated with lightheadedness, dizziness, nausea, diaphoresis or shortness of breath.  His pain usually happen once a week with last episode happened several days ago.  He has not been evaluated for this chest pain before and denies any significant cardiac history.  Denies any strong family history of cardiac disease or premature cardiac death.  He admits to perform heavy lifting at work but denies having associated chest pain with exertion.  Recently no fever or productive cough or arm pain.  He does admits to having increasing stress and does self medicate with alcohol.  States he drinks about 1-2 beer a night for the past several weeks and occasionally use marijuana.  Currently he does not have any significant discomfort.  He did feel a sensation of chest tightness this morning which prompted him to come but that was short lasting.  Past Medical History:  Diagnosis Date  . Chronic low back pain   . Family history of prostate cancer   . History of hyperthyroidism 2009   prior oral radioablation therapy  . Hypothyroidism    s/p ablation  . Impaired fasting blood sugar   . Obesity   . Obesity     Patient Active Problem List   Diagnosis Date Noted  . Elevated blood-pressure reading without diagnosis of hypertension 09/10/2015  . Decreased pulses in feet 09/10/2015  . Skin lesion  09/10/2015  . Proteinuria 09/10/2015  . Encounter for health maintenance examination in adult 07/30/2015  . Screening for prostate cancer 07/30/2015  . Family history of prostate cancer in father 07/30/2015  . Other specified hypothyroidism 07/30/2015  . Impaired fasting blood sugar 07/30/2015  . Obesity 07/30/2015  . Chronic low back pain 07/30/2015  . PARONYCHIA 12/01/2005    Past Surgical History:  Procedure Laterality Date  . NO PAST SURGERIES  07/2015       Home Medications    Prior to Admission medications   Medication Sig Start Date End Date Taking? Authorizing Provider  aspirin-sod bicarb-citric acid (ALKA-SELTZER) 325 MG TBEF tablet Take 325 mg by mouth every 6 (six) hours as needed (cold sx).    [provider]  azithromycin (ZITHROMAX) 250 MG tablet Take 1 tablet (250 mg total) by mouth daily. Take first 2 tablets together, then 1 every day until finished. 01/03/17   Dartha Lodge, PA-C  levothyroxine (SYNTHROID) 88 MCG tablet Take 1 tablet (88 mcg total) by mouth daily before breakfast. Patient not taking: Reported on 01/03/2017 09/13/15   Tysinger, Kermit Balo, PA-C  oseltamivir (TAMIFLU) 75 MG capsule Take 1 capsule (75 mg total) by mouth every 12 (twelve) hours. Patient not taking: Reported on 01/03/2017 02/21/16   Raeford Razor, MD  SYNTHROID 50 MCG tablet TAKE 1 TABLET BY MOUTH DAILY BEFORE BREAKFAST Patient not taking: Reported on 01/03/2017 10/08/15   Tysinger, Kermit Balo,  PA-C    Family History Family History  Problem Relation Age of Onset  . Cirrhosis Mother        died of cirrhosis  . Alcohol abuse Mother   . Crohn's disease Mother   . Stroke Mother   . Hypertension Father   . Cancer Father        7667  . Diabetes Brother   . Heart disease Neg Hx     Social History Social History   Tobacco Use  . Smoking status: Never Smoker  . Smokeless tobacco: Never Used  Substance Use Topics  . Alcohol use: Yes    Alcohol/week: 4.8 oz    Types: 5 Cans  of beer, 3 Shots of liquor per week  . Drug use: Yes    Types: Marijuana     Allergies   Patient has no known allergies.   Review of Systems Review of Systems  All other systems reviewed and are negative.    Physical Exam Updated Vital Signs BP (!) 150/97 (BP Location: Left Arm)   Pulse 74   Temp 98.3 F (36.8 C) (Oral)   Resp 13   Ht 5\' 11"  (1.803 m)   Wt 111.1 kg (245 lb)   SpO2 97%   BMI 34.17 kg/m   Physical Exam  Constitutional: He appears well-developed and well-nourished. No distress.  HENT:  Head: Atraumatic.  Eyes: Conjunctivae are normal.  Neck: Neck supple. No thyromegaly present.  Cardiovascular: Normal rate, regular rhythm, intact distal pulses and normal pulses.  Pulmonary/Chest: Effort normal and breath sounds normal. He has no decreased breath sounds. He has no wheezes. He has no rales.  Abdominal: Soft. There is no tenderness.  Musculoskeletal: Normal range of motion.       Right lower leg: He exhibits no edema.       Left lower leg: He exhibits no edema.  Neurological: He is alert.  Skin: Capillary refill takes less than 2 seconds. No rash noted.  Psychiatric: He has a normal mood and affect.  Nursing note and vitals reviewed.    ED Treatments / Results  Labs (all labs ordered are listed, but only abnormal results are displayed) Labs Reviewed  BASIC METABOLIC PANEL - Abnormal; Notable for the following components:      Result Value   Glucose, Bld 111 (*)    All other components within normal limits  CBC  TROPONIN I  TROPONIN I    EKG  EKG Interpretation None     ECG interpretation: Normal sinus rhythm 72 bpm.  Normal axis.  Normal intervals.  Normal QRS morphology.  Normal ST segment.  Nonspecific T wave abnormalities present.  When compared with ECG of July 30, 2015, nonspecific T wave abnormality is new.    Radiology Dg Chest 2 View  Result Date: 03/10/2017 CLINICAL DATA:  Upper chest pain 3-4 days and again this morning.  EXAM: CHEST  2 VIEW COMPARISON:  01/03/2017 FINDINGS: The heart size and mediastinal contours are within normal limits. Both lungs are clear. The visualized skeletal structures are unremarkable. IMPRESSION: No active cardiopulmonary disease. Electronically Signed   By: Elberta Fortisaniel  Boyle M.D.   On: 03/10/2017 07:06    Procedures Procedures (including critical care time)  Medications Ordered in ED Medications - No data to display   Initial Impression / Assessment and Plan / ED Course  I have reviewed the triage vital signs and the nursing notes.  Pertinent labs & imaging results that were available during my care of  the patient were reviewed by me and considered in my medical decision making (see chart for details).     BP (!) 171/125   Pulse 77   Temp 98.3 F (36.8 C) (Oral)   Resp 12   Ht 5\' 11"  (1.803 m)   Wt 111.1 kg (245 lb)   SpO2 99%   BMI 34.17 kg/m    Final Clinical Impressions(s) / ED Diagnoses   Final diagnoses:  Atypical chest pain    ED Discharge Orders    None     7:32 AM Pt here with atypical CP.  HEART score of 2, low risk of MACE.  PERC negative, doubt PE.  Will perform screening exam.   10:52 AM Normal serial troponin.  Pt remain asymptomatic.  Pt stable for discharge.  Recommend f/u with PCP for further care.  Return precaution given.     Fayrene Helper, PA-C 03/10/17 1053    Dione Booze, MD 03/10/17 2227

## 2018-03-04 ENCOUNTER — Encounter (HOSPITAL_COMMUNITY): Payer: Self-pay | Admitting: Emergency Medicine

## 2018-03-04 ENCOUNTER — Emergency Department (HOSPITAL_COMMUNITY)
Admission: EM | Admit: 2018-03-04 | Discharge: 2018-03-04 | Discharge status: Against Medical Advice | Payer: BLUE CROSS/BLUE SHIELD | Attending: Emergency Medicine | Admitting: Emergency Medicine

## 2018-03-04 DIAGNOSIS — E039 Hypothyroidism, unspecified: Secondary | ICD-10-CM | POA: Diagnosis not present

## 2018-03-04 DIAGNOSIS — J705 Respiratory conditions due to smoke inhalation: Secondary | ICD-10-CM | POA: Diagnosis present

## 2018-03-04 DIAGNOSIS — T59811A Toxic effect of smoke, accidental (unintentional), initial encounter: Secondary | ICD-10-CM
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
## 2018-03-04 NOTE — ED Notes (Signed)
Patient no longer in room. Not in X-ray. PA notified.
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
## 2018-04-03 ENCOUNTER — Emergency Department (HOSPITAL_COMMUNITY)
Admission: EM | Admit: 2018-04-03 | Discharge: 2018-04-03 | Disposition: A | Discharge status: Home / Self Care | Payer: BLUE CROSS/BLUE SHIELD | Attending: Emergency Medicine | Admitting: Emergency Medicine

## 2018-04-03 ENCOUNTER — Other Ambulatory Visit: Payer: Self-pay

## 2018-04-03 ENCOUNTER — Encounter (HOSPITAL_COMMUNITY): Payer: Self-pay | Admitting: *Deleted

## 2018-04-03 DIAGNOSIS — R05 Cough: Secondary | ICD-10-CM | POA: Diagnosis not present

## 2018-04-03 DIAGNOSIS — R69 Illness, unspecified: Secondary | ICD-10-CM

## 2018-04-03 DIAGNOSIS — J111 Influenza due to unidentified influenza virus with other respiratory manifestations: Secondary | ICD-10-CM

## 2018-04-03 DIAGNOSIS — R0981 Nasal congestion: Secondary | ICD-10-CM | POA: Diagnosis not present

## 2018-04-03 DIAGNOSIS — R509 Fever, unspecified: Secondary | ICD-10-CM | POA: Diagnosis not present

## 2018-04-03 MED ORDER — BENZONATATE 200 MG PO CAPS: 200.0000 mg | ORAL_CAPSULE | Freq: Three times a day (TID) | ORAL | 0 refills | Status: AC | PRN

## 2018-04-03 MED ORDER — BALOXAVIR MARBOXIL(80 MG DOSE) 2 X 40 MG PO TBPK: 80.0000 mg | ORAL_TABLET | Freq: Once | ORAL | 0 refills | Status: AC

## 2018-04-03 NOTE — ED Provider Notes (Signed)
[facility] EMERGENCY DEPARTMENT Provider Note   CSN: [id number] Arrival date & time: 04/03/18  0846    History   Chief Complaint Chief Complaint  Patient presents with  . Influenza    HPI Troy Alvarado is a 43 y.o. male.     43 year old male presents with complaint of sudden onset fevers, body aches, cough, congestion.  Patient states symptoms started 2 days ago, fever 101, taking Motrin for his fever.  Denies asthma or history of chronic lung disease, is not a smoker, denies recent travel or exposure to those who have recently traveled, no known sick contacts.     Past Medical History:  Diagnosis Date  . Chronic low back pain   . Family history of prostate cancer   . History of hyperthyroidism 2009   prior oral radioablation therapy  . Hypothyroidism    s/p ablation  . Impaired fasting blood sugar   . Obesity   . Obesity     Patient Active Problem List   Diagnosis Date Noted  . Elevated blood-pressure reading without diagnosis of hypertension 09/10/2015  . Decreased pulses in feet 09/10/2015  . Skin lesion 09/10/2015  . Proteinuria 09/10/2015  . Encounter for health maintenance examination in adult 07/30/2015  . Screening for prostate cancer 07/30/2015  . Family history of prostate cancer in father 07/30/2015  . Other specified hypothyroidism 07/30/2015  . Impaired fasting blood sugar 07/30/2015  . Obesity 07/30/2015  . Chronic low back pain 07/30/2015  . PARONYCHIA 12/01/2005    Past Surgical History:  Procedure Laterality Date  . NO PAST SURGERIES  07/2015        Home Medications    Prior to Admission medications   Medication Sig Start Date End Date Taking? Authorizing Provider  Baloxavir Marboxil,80 MG Dose, (XOFLUZA) 2 x 40 MG TBPK Take 80 mg by mouth once for 1 dose. 04/03/18 04/03/18  Jeannie Fend, PA-C  benzonatate (TESSALON) 200 MG capsule Take 1 capsule (200 mg total) by mouth 3 (three) times daily as needed for up to 10  days for cough. 04/03/18 04/13/18  Jeannie Fend, PA-C  SYNTHROID 50 MCG tablet TAKE 1 TABLET BY MOUTH DAILY BEFORE BREAKFAST Patient not taking: Reported on 01/03/2017 10/08/15   Tysinger, Kermit Balo, PA-C    Family History Family History  Problem Relation Age of Onset  . Cirrhosis Mother        died of cirrhosis  . Alcohol abuse Mother   . Crohn's disease Mother   . Stroke Mother   . Hypertension Father   . Cancer Father        76  . Diabetes Brother   . Heart disease Neg Hx     Social History Social History   Tobacco Use  . Smoking status: Never Smoker  . Smokeless tobacco: Never Used  Substance Use Topics  . Alcohol use: Yes    Alcohol/week: 8.0 standard drinks    Types: 5 Cans of beer, 3 Shots of liquor per week  . Drug use: Yes    Types: Marijuana     Allergies   Patient has no known allergies.   Review of Systems Review of Systems  Constitutional: Positive for fever.  HENT: Positive for congestion. Negative for ear pain, sneezing and sore throat.   Respiratory: Positive for cough. Negative for shortness of breath.   Gastrointestinal: Negative for nausea and vomiting.  Musculoskeletal: Positive for arthralgias and myalgias.  Skin: Negative for rash.  Allergic/Immunologic: Negative  for immunocompromised state.  Neurological: Negative for headaches.  Hematological: Negative for adenopathy.  Psychiatric/Behavioral: Negative for confusion.  All other systems reviewed and are negative.    Physical Exam Updated Vital Signs BP (!) 145/105 (BP Location: Right Arm)   Pulse 94   Temp 99.4 F (37.4 C) (Oral)   Resp 18   SpO2 97%   Physical Exam Vitals signs and nursing note reviewed.  Constitutional:      General: He is not in acute distress.    Appearance: He is well-developed. He is not diaphoretic.  HENT:     Head: Normocephalic and atraumatic.     Right Ear: Tympanic membrane and ear canal normal.     Left Ear: Tympanic membrane and ear canal normal.      Nose: Congestion present.     Mouth/Throat:     Mouth: Mucous membranes are moist.     Pharynx: Posterior oropharyngeal erythema present. No oropharyngeal exudate.  Eyes:     Conjunctiva/sclera: Conjunctivae normal.  Neck:     Musculoskeletal: Neck supple.  Cardiovascular:     Rate and Rhythm: Normal rate and regular rhythm.     Pulses: Normal pulses.     Heart sounds: Normal heart sounds. No murmur.  Pulmonary:     Effort: Pulmonary effort is normal.     Breath sounds: Normal breath sounds. No wheezing.  Lymphadenopathy:     Cervical: No cervical adenopathy.  Skin:    General: Skin is warm and dry.     Findings: No rash.  Neurological:     Mental Status: He is alert and oriented to person, place, and time.  Psychiatric:        Behavior: Behavior normal.      ED Treatments / Results  Labs (all labs ordered are listed, but only abnormal results are displayed) Labs Reviewed - No data to display  EKG None  Radiology No results found.  Procedures Procedures (including critical care time)  Medications Ordered in ED Medications - No data to display   Initial Impression / Assessment and Plan / ED Course  I have reviewed the triage vital signs and the nursing notes.  Pertinent labs & imaging results that were available during my care of the patient were reviewed by me and considered in my medical decision making (see chart for details).  Clinical Course as of Apr 03 927  Wed Apr 03, 2018  5819 43 year old male with flulike symptoms x2 days.  Patient is a non-smoker, no history of asthma or chronic lung disease, no risk factors for covid19.  Patient is well-appearing, lung sounds are clear.  Due to report of symptoms onset just less than 48 hours, suggest patient take Tamiflu for his likely flulike illness.  Also recommend OTC medications and Tessalon for his cough.  Patient to return to ER for severe or concerning symptoms or follow-up with his PCP.  Patient  verbalizes understanding of discharge instructions and plan. Did have a flu shot this year.   [LM]    Clinical Course User Index [LM] Jeannie Fend, PA-C   Final Clinical Impressions(s) / ED Diagnoses   Final diagnoses:  Influenza-like illness    ED Discharge Orders         Ordered    Baloxavir Marboxil,80 MG Dose, (XOFLUZA) 2 x 40 MG TBPK   Once     04/03/18 0903    benzonatate (TESSALON) 200 MG capsule  3 times daily PRN     04/03/18 1610  Jeannie Fend, PA-C 04/03/18 4503    Derwood Kaplan, MD 04/05/18 8882

## 2018-04-03 NOTE — ED Triage Notes (Signed)
Pt reports flu like symptoms x 2 days. Has bodyaches, fever, cough, chills.

## 2018-04-03 NOTE — Discharge Instructions (Addendum)
Follow-up with your doctor, return to ER for severe concerning symptoms. Saline sinus rinse daily, Zyrtec daily. Take Coricidin HBP for symptom relief. Take Xofluza as prescribed. Take Motrin and Tylenol as needed as directed for body aches and fevers. He may return to work when you are fever free without taking fever reducing medication such as Motrin or Tylenol for 24 hours.

## 2018-09-24 ENCOUNTER — Other Ambulatory Visit: Payer: Self-pay

## 2018-09-24 ENCOUNTER — Emergency Department (HOSPITAL_COMMUNITY)
Admission: EM | Admit: 2018-09-24 | Discharge: 2018-09-24 | Disposition: A | Discharge status: Home / Self Care | Payer: BC Managed Care – PPO | Attending: Emergency Medicine | Admitting: Emergency Medicine

## 2018-09-24 DIAGNOSIS — M5441 Lumbago with sciatica, right side: Secondary | ICD-10-CM | POA: Diagnosis not present

## 2018-09-24 DIAGNOSIS — M5442 Lumbago with sciatica, left side: Secondary | ICD-10-CM | POA: Insufficient documentation

## 2018-09-24 DIAGNOSIS — M545 Low back pain: Secondary | ICD-10-CM | POA: Diagnosis present

## 2018-09-24 MED ORDER — CYCLOBENZAPRINE HCL 10 MG PO TABS: 10.0000 mg | ORAL_TABLET | Freq: Two times a day (BID) | ORAL | 0 refills | Status: DC | PRN

## 2018-09-24 MED ORDER — KETOROLAC TROMETHAMINE 30 MG/ML IJ SOLN
30.0000 mg | Freq: Once | INTRAMUSCULAR | Status: AC
  Administered 2018-09-24: 30 mg via INTRAMUSCULAR
  Filled 2018-09-24: qty 1

## 2018-09-24 MED ORDER — OXYCODONE-ACETAMINOPHEN 5-325 MG PO TABS: 1.0000 | ORAL_TABLET | Freq: Four times a day (QID) | ORAL | 0 refills | Status: DC | PRN

## 2018-09-24 NOTE — ED Triage Notes (Signed)
Pt reports lower back pain radiating to both legs since Sunday. Pt reports denies change is urination.

## 2018-09-24 NOTE — ED Notes (Signed)
Patient verbalizes understanding of discharge instructions. Opportunity for questioning and answering were provided. Armband removed by staff , patient discharged from ED. 

## 2018-09-24 NOTE — ED Provider Notes (Signed)
[facility] Provider Note   CSN: [id number] Arrival date & time: 09/24/18  1708     History   Chief Complaint Chief Complaint  Patient presents with  . Back Pain    HPI Troy Alvarado is a 43 y.o. male.     HPI   43 year old male presents today with back pain.  Patient notes a 2-day history of mid right lower back pain radiation to the bilateral posterior legs.  Patient denies any distal neurological deficits abdominal pain fever trauma IV drug use or any other red flags.  Patient notes heat helped at home, also notes that he took Children's Motrin but did not have any relief of symptoms.  He notes symptoms are worse when trying to sleep at night.    Past Medical History:  Diagnosis Date  . Chronic low back pain   . Family history of prostate cancer   . History of hyperthyroidism 2009   prior oral radioablation therapy  . Hypothyroidism    s/p ablation  . Impaired fasting blood sugar   . Obesity   . Obesity     Patient Active Problem List   Diagnosis Date Noted  . Elevated blood-pressure reading without diagnosis of hypertension 09/10/2015  . Decreased pulses in feet 09/10/2015  . Skin lesion 09/10/2015  . Proteinuria 09/10/2015  . Encounter for health maintenance examination in adult 07/30/2015  . Screening for prostate cancer 07/30/2015  . Family history of prostate cancer in father 07/30/2015  . Other specified hypothyroidism 07/30/2015  . Impaired fasting blood sugar 07/30/2015  . Obesity 07/30/2015  . Chronic low back pain 07/30/2015  . PARONYCHIA 12/01/2005    Past Surgical History:  Procedure Laterality Date  . NO PAST SURGERIES  07/2015        Home Medications    Prior to Admission medications   Medication Sig Start Date End Date Taking? Authorizing Provider  cyclobenzaprine (FLEXERIL) 10 MG tablet Take 1 tablet (10 mg total) by mouth 2 (two) times daily as needed for muscle spasms. 09/24/18   Marabella Popiel,  Tinnie GensJeffrey, PA-C  oxyCODONE-acetaminophen (PERCOCET/ROXICET) 5-325 MG tablet Take 1 tablet by mouth every 6 (six) hours as needed. 09/24/18   Donya Hitch, Tinnie GensJeffrey, PA-C  SYNTHROID 50 MCG tablet TAKE 1 TABLET BY MOUTH DAILY BEFORE BREAKFAST Patient not taking: Reported on 01/03/2017 10/08/15   Tysinger, Kermit Baloavid S, PA-C    Family History Family History  Problem Relation Age of Onset  . Cirrhosis Mother        died of cirrhosis  . Alcohol abuse Mother   . Crohn's disease Mother   . Stroke Mother   . Hypertension Father   . Cancer Father        2967  . Diabetes Brother   . Heart disease Neg Hx     Social History Social History   Tobacco Use  . Smoking status: Never Smoker  . Smokeless tobacco: Never Used  Substance Use Topics  . Alcohol use: Yes    Alcohol/week: 8.0 standard drinks    Types: 5 Cans of beer, 3 Shots of liquor per week  . Drug use: Yes    Types: Marijuana     Allergies   Patient has no known allergies.   Review of Systems Review of Systems  All other systems reviewed and are negative.    Physical Exam Updated Vital Signs BP 130/88   Pulse 97   Temp 99 F (37.2 C) (Oral)   Resp 18  SpO2 97%   Physical Exam Vitals signs and nursing note reviewed.  Constitutional:      Appearance: He is well-developed.  HENT:     Head: Normocephalic and atraumatic.  Eyes:     General: No scleral icterus.       Right eye: No discharge.        Left eye: No discharge.     Conjunctiva/sclera: Conjunctivae normal.     Pupils: Pupils are equal, round, and reactive to light.  Neck:     Musculoskeletal: Normal range of motion.     Vascular: No JVD.     Trachea: No tracheal deviation.  Pulmonary:     Effort: Pulmonary effort is normal.     Breath sounds: No stridor.  Abdominal:     General: There is no distension.     Palpations: Abdomen is soft.     Tenderness: There is no abdominal tenderness.  Musculoskeletal:     Comments: Minimal tenderness palpation of right lower  lumbar and sacral soft tissue, no midline lumbar thoracic or cervical tenderness, bilateral lower extremity sensation strength motor function intact  Neurological:     Mental Status: He is alert and oriented to person, place, and time.     Coordination: Coordination normal.  Psychiatric:        Behavior: Behavior normal.        Thought Content: Thought content normal.        Judgment: Judgment normal.      ED Treatments / Results  Labs (all labs ordered are listed, but only abnormal results are displayed) Labs Reviewed - No data to display  EKG None  Radiology No results found.  Procedures Procedures (including critical care time)  Medications Ordered in ED Medications - No data to display   Initial Impression / Assessment and Plan / ED Course  I have reviewed the triage vital signs and the nursing notes.  Pertinent labs & imaging results that were available during my care of the patient were reviewed by me and considered in my medical decision making (see chart for details).        43 year old male presents with back pain.  Patient does have sciatic in both legs.  He is well-appearing, with no red flags.  I have instructed him to use ibuprofen and Tylenol muscle relaxers as needed.  I will give him a short course of Percocet as needed for more severe symptoms.  If he develops any new or worsening signs or symptoms he will return for further evaluation.  Final Clinical Impressions(s) / ED Diagnoses   Final diagnoses:  Acute right-sided low back pain with bilateral sciatica    ED Discharge Orders         Ordered    oxyCODONE-acetaminophen (PERCOCET/ROXICET) 5-325 MG tablet  Every 6 hours PRN,   Status:  Discontinued     09/24/18 1827    cyclobenzaprine (FLEXERIL) 10 MG tablet  2 times daily PRN     09/24/18 1827    oxyCODONE-acetaminophen (PERCOCET/ROXICET) 5-325 MG tablet  Every 6 hours PRN     09/24/18 1828           Okey Regal, PA-C 09/24/18 1829     Tegeler, Gwenyth Allegra, MD 09/25/18 [phone]

## 2018-09-24 NOTE — Discharge Instructions (Signed)
Please read attached information. If you experience any new or worsening signs or symptoms please return to the emergency room for evaluation. Please follow-up with your primary care provider or specialist as discussed. Please use medication prescribed only as directed and discontinue taking if you have any concerning signs or symptoms.   °

## 2018-11-11 IMAGING — CR DG CHEST 2V
2 series · 2 of 2 positions shown · non-contrast
Comparison: March 12, 2006

CLINICAL DATA: Chest pain and cough

EXAM:
CHEST  2 VIEW

[chest pa]
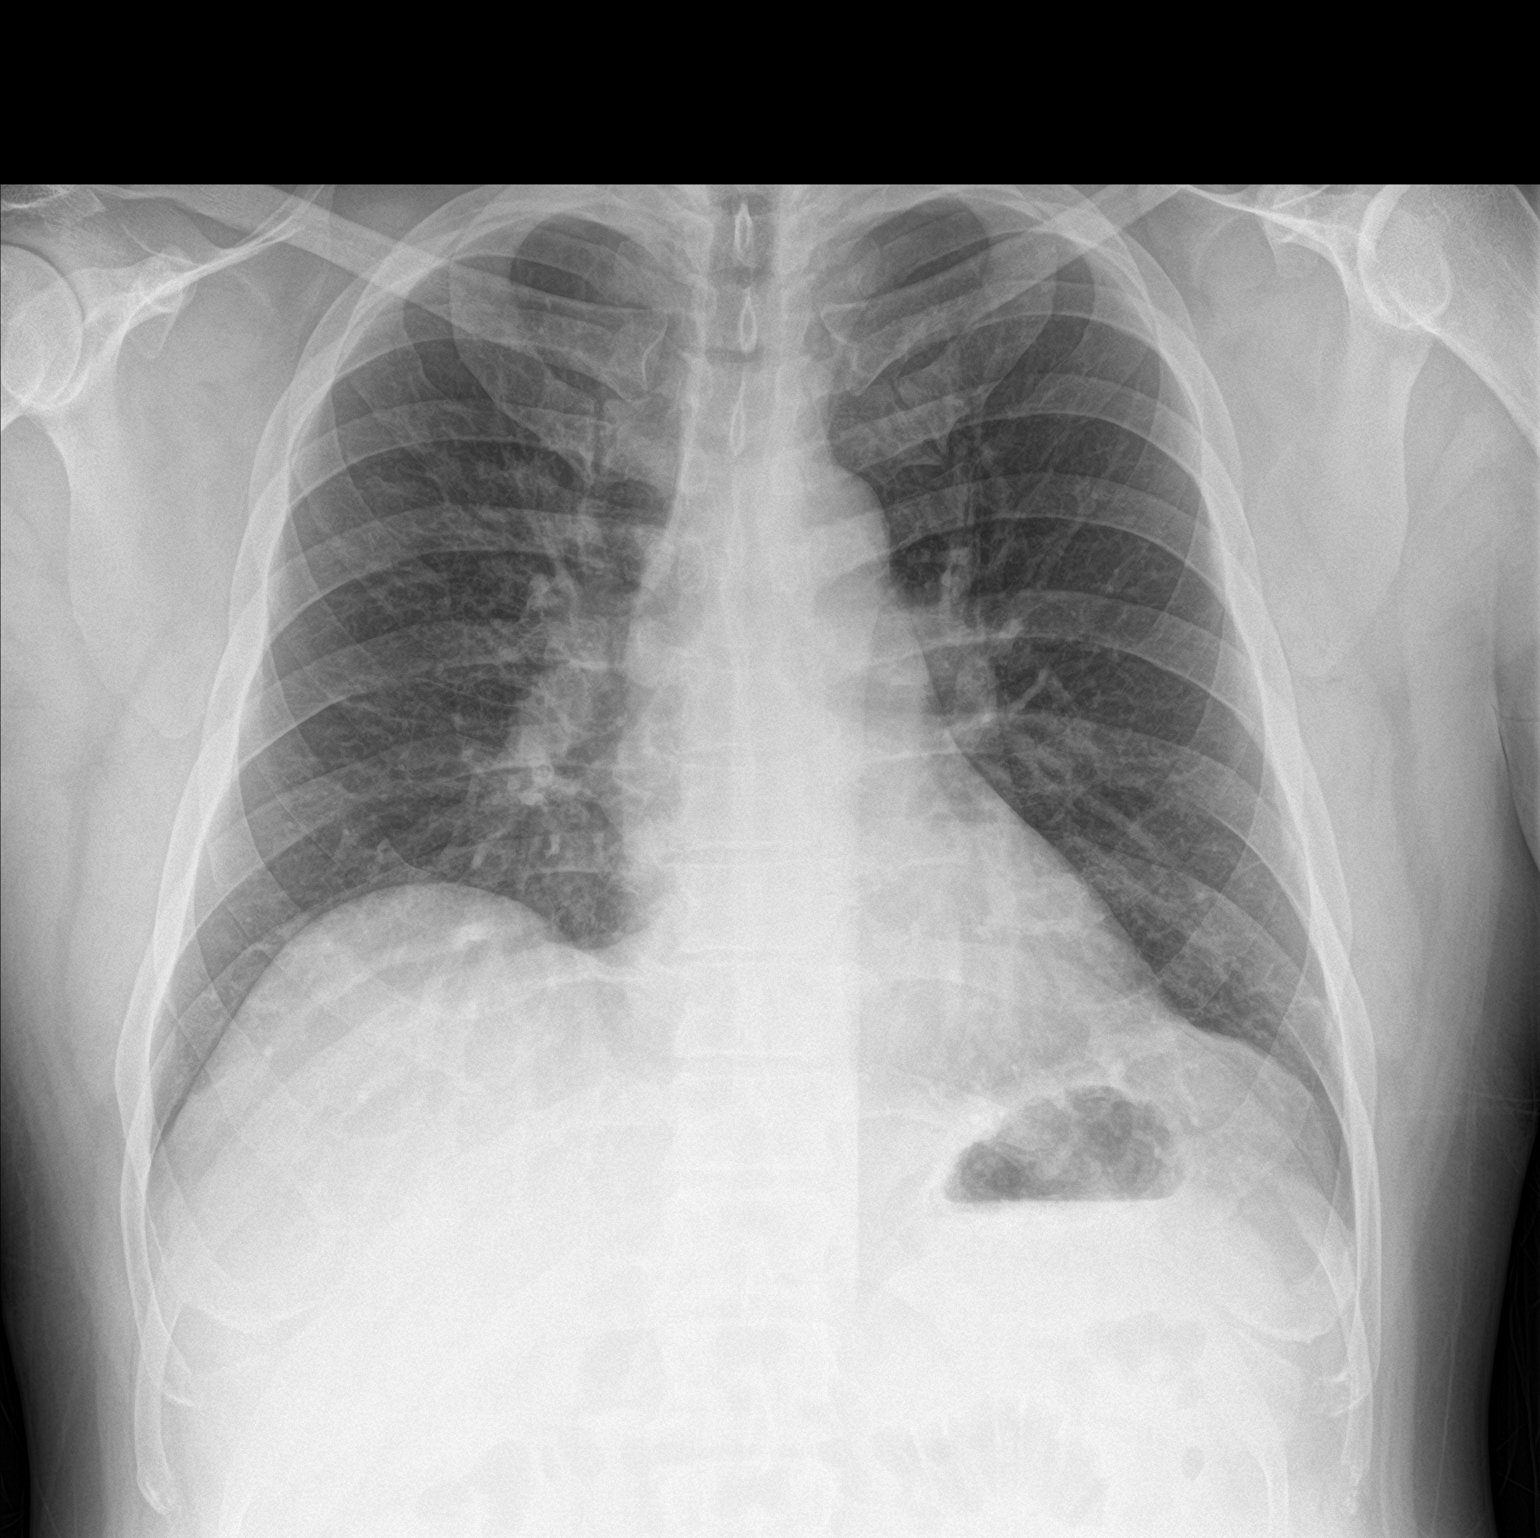

[chest lat]
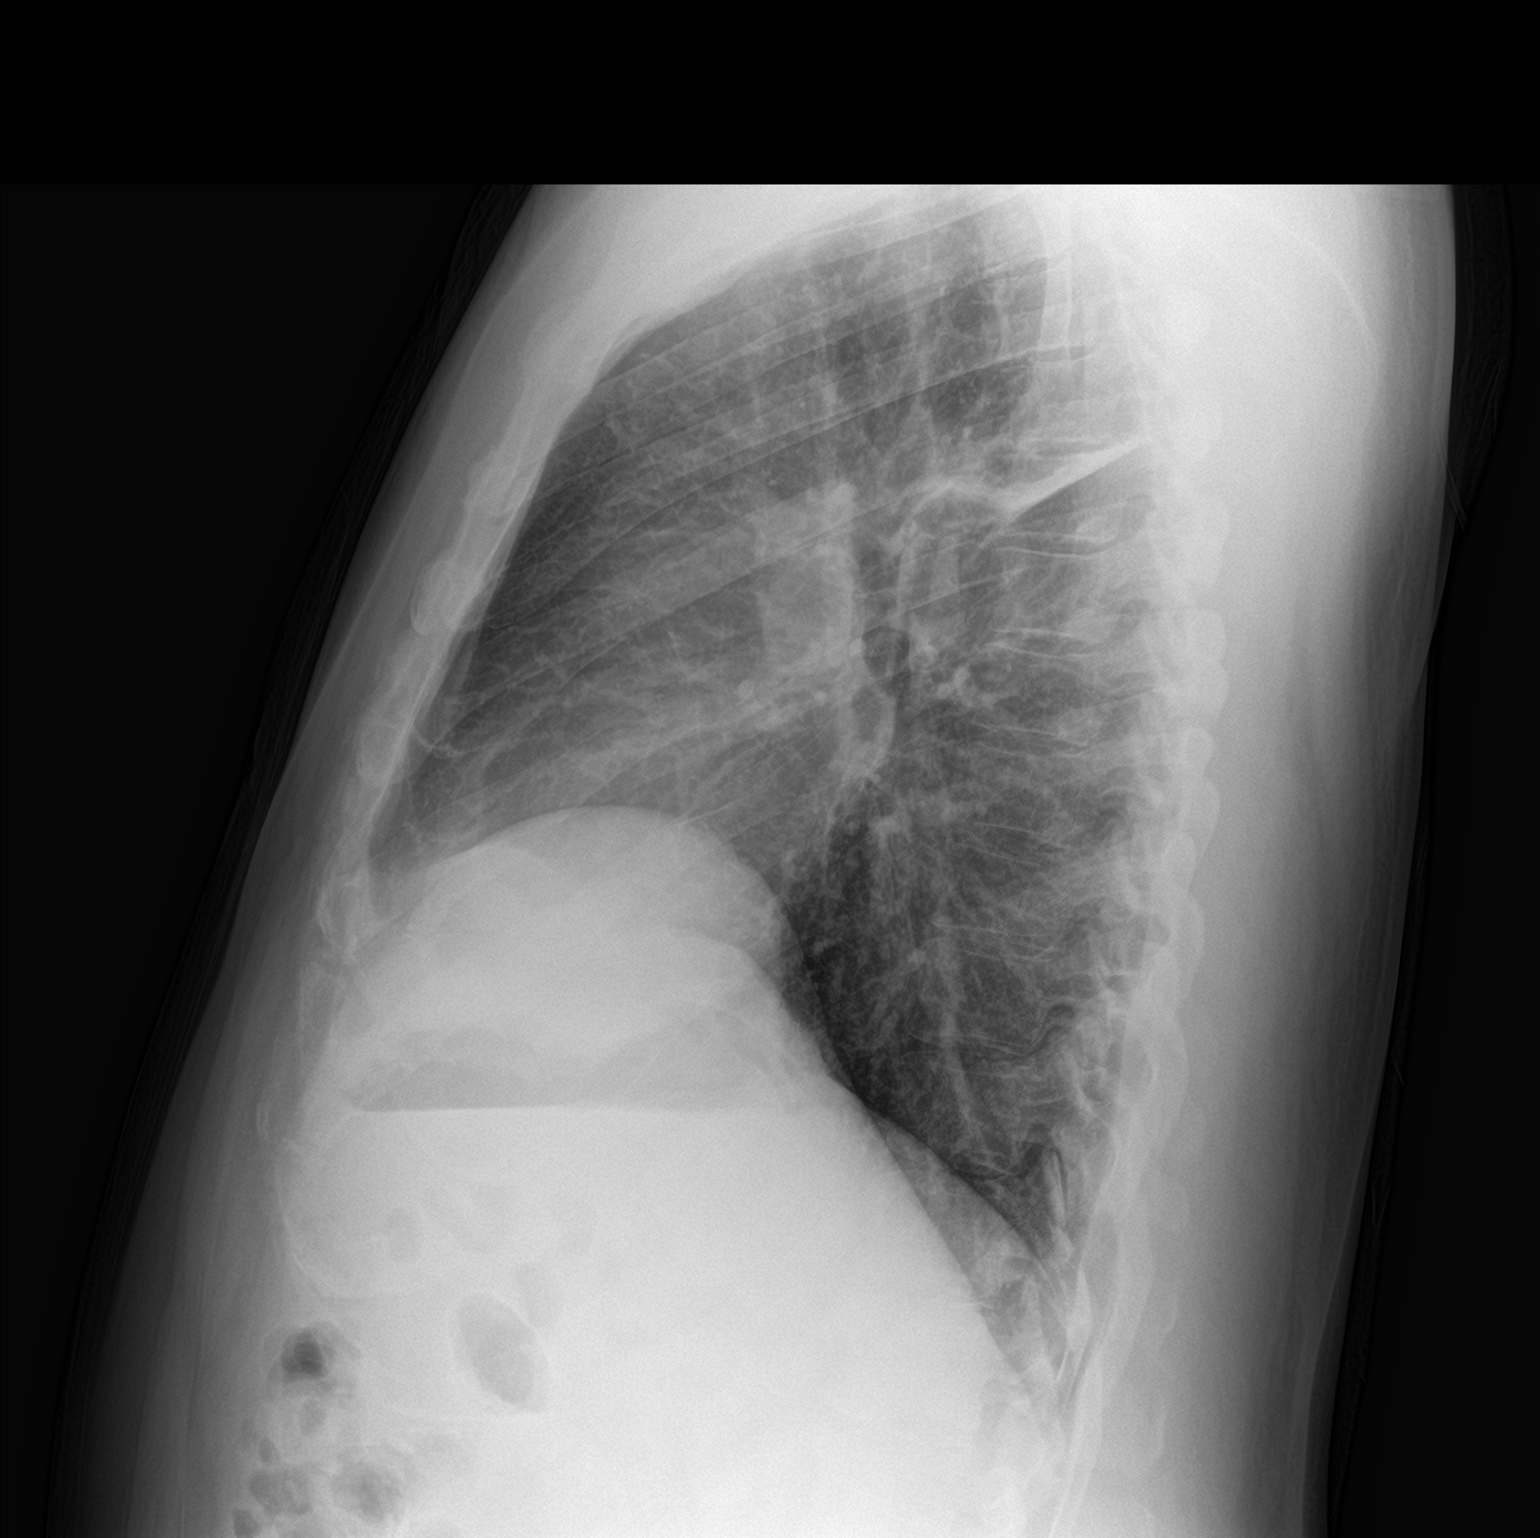

[2 of 2 positions shown; findings below may reference images not displayed]

FINDINGS: There is atelectatic change in the posterior segment right upper
lobe. Lungs elsewhere are clear. Heart size and pulmonary
vascularity are normal. No adenopathy. No evident bone lesions.
IMPRESSION: Atelectatic change posterior segment right upper lobe, better seen
on lateral view. Early pneumonia in this area cannot be excluded.
Lungs elsewhere clear. No adenopathy.

## 2018-11-18 ENCOUNTER — Other Ambulatory Visit: Payer: Self-pay | Admitting: *Deleted

## 2018-11-18 DIAGNOSIS — Z20822 Contact with and (suspected) exposure to covid-19: Secondary | ICD-10-CM

## 2018-11-19 LAB — NOVEL CORONAVIRUS, NAA: SARS-CoV-2, NAA: NOT DETECTED

## 2018-11-22 ENCOUNTER — Other Ambulatory Visit: Payer: Self-pay

## 2018-11-22 ENCOUNTER — Emergency Department (HOSPITAL_COMMUNITY)
Admission: EM | Admit: 2018-11-22 | Discharge: 2018-11-22 | Disposition: A | Discharge status: Home / Self Care | Payer: BC Managed Care – PPO | Attending: Emergency Medicine | Admitting: Emergency Medicine

## 2018-11-22 ENCOUNTER — Encounter (HOSPITAL_COMMUNITY): Payer: Self-pay | Admitting: *Deleted

## 2018-11-22 DIAGNOSIS — H8110 Benign paroxysmal vertigo, unspecified ear: Secondary | ICD-10-CM | POA: Diagnosis not present

## 2018-11-22 DIAGNOSIS — E039 Hypothyroidism, unspecified: Secondary | ICD-10-CM | POA: Insufficient documentation

## 2018-11-22 DIAGNOSIS — R42 Dizziness and giddiness: Secondary | ICD-10-CM | POA: Diagnosis present

## 2018-11-22 LAB — CBC
HCT: 43.4 % (ref 39.0–52.0)
Hemoglobin: 14.5 g/dL (ref 13.0–17.0)
MCH: 29.4 pg (ref 26.0–34.0)
MCHC: 33.4 g/dL (ref 30.0–36.0)
MCV: 87.9 fL (ref 80.0–100.0)
Platelets: 260 10*3/uL (ref 150–400)
RBC: 4.94 MIL/uL (ref 4.22–5.81)
RDW: 13.2 % (ref 11.5–15.5)
WBC: 4.6 10*3/uL (ref 4.0–10.5)
nRBC: 0 % (ref 0.0–0.2)

## 2018-11-22 LAB — BASIC METABOLIC PANEL
Anion gap: 10 (ref 5–15)
BUN: 17 mg/dL (ref 6–20)
CO2: 22 mmol/L (ref 22–32)
Calcium: 9.6 mg/dL (ref 8.9–10.3)
Chloride: 106 mmol/L (ref 98–111)
Creatinine, Ser: 1.09 mg/dL (ref 0.61–1.24)
GFR calc Af Amer: >60 mL/min (ref >60)
GFR calc non Af Amer: >60 mL/min (ref >60)
Glucose, Bld: 88 mg/dL (ref 70–99)
Potassium: 3.8 mmol/L (ref 3.5–5.1)
Sodium: 138 mmol/L (ref 135–145)

## 2018-11-22 MED ORDER — MECLIZINE HCL 12.5 MG PO TABS: 12.5000 mg | ORAL_TABLET | Freq: Three times a day (TID) | ORAL | 0 refills | Status: DC | PRN

## 2018-11-22 MED ORDER — SODIUM CHLORIDE 0.9% FLUSH: 3.0000 mL | Freq: Once | INTRAVENOUS | Status: DC

## 2018-11-22 NOTE — Discharge Instructions (Addendum)
Please get established with a PCP soon as possible.  Please take your meclizine, as prescribed.  Return to the ED or seek medical attention should he develop any worsening headaches, unrelenting dizziness, chest pain or shortness of breath, uncontrolled nausea or vomiting, numbness, tingling, weakness, visual changes, or any other neurologic deficits.

## 2018-11-22 NOTE — ED Triage Notes (Signed)
C/o dizziness onset wed. , states he was at work today and states he couldn't work.

## 2018-11-22 NOTE — ED Provider Notes (Signed)
[facility] EMERGENCY DEPARTMENT Provider Note   CSN: [id number] Arrival date & time: 11/22/18  1130     History   Chief Complaint Chief Complaint  Patient presents with   Dizziness   Headache    HPI Troy Alvarado is a 43 y.o. male with PMH significant for hyperthyroidism status post ablation who presents to the ED accompanied by his wife with complaints of dizziness.  Patient states that he has had 2 episodes of room-spinning dizziness that was brought on by head rotation.  He says he has an associated headache, nausea, and flushed-feeling with these episodes.  Patient is not currently having an episode.  He denies any chest pain, shortness of breath, ataxic gait, weakness, numbness, tinnitus, hearing loss, recent trauma, neck stiffness, or any other symptoms.     HPI  Past Medical History:  Diagnosis Date   Chronic low back pain    Family history of prostate cancer    History of hyperthyroidism 2009   prior oral radioablation therapy   Hypothyroidism    s/p ablation   Impaired fasting blood sugar    Obesity    Obesity     Patient Active Problem List   Diagnosis Date Noted   Elevated blood-pressure reading without diagnosis of hypertension 09/10/2015   Decreased pulses in feet 09/10/2015   Skin lesion 09/10/2015   Proteinuria 09/10/2015   Encounter for health maintenance examination in adult 07/30/2015   Screening for prostate cancer 07/30/2015   Family history of prostate cancer in father 07/30/2015   Other specified hypothyroidism 07/30/2015   Impaired fasting blood sugar 07/30/2015   Obesity 07/30/2015   Chronic low back pain 07/30/2015   PARONYCHIA 12/01/2005    Past Surgical History:  Procedure Laterality Date   NO PAST SURGERIES  07/2015        Home Medications    Prior to Admission medications   Medication Sig Start Date End Date Taking? Authorizing Provider  cyclobenzaprine (FLEXERIL) 10 MG tablet Take 1  tablet (10 mg total) by mouth 2 (two) times daily as needed for muscle spasms. 09/24/18   Hedges, Tinnie Gens, PA-C  meclizine (ANTIVERT) 12.5 MG tablet Take 1 tablet (12.5 mg total) by mouth 3 (three) times daily as needed for dizziness. 11/22/18   Lorelee New, PA-C  oxyCODONE-acetaminophen (PERCOCET/ROXICET) 5-325 MG tablet Take 1 tablet by mouth every 6 (six) hours as needed. 09/24/18   Hedges, Tinnie Gens, PA-C  SYNTHROID 50 MCG tablet TAKE 1 TABLET BY MOUTH DAILY BEFORE BREAKFAST Patient not taking: Reported on 01/03/2017 10/08/15   Tysinger, Kermit Balo, PA-C    Family History Family History  Problem Relation Age of Onset   Cirrhosis Mother        died of cirrhosis   Alcohol abuse Mother    Crohn's disease Mother    Stroke Mother    Hypertension Father    Cancer Father        47   Diabetes Brother    Heart disease Neg Hx     Social History Social History   Tobacco Use   Smoking status: Never Smoker   Smokeless tobacco: Never Used  Substance Use Topics   Alcohol use: Yes    Alcohol/week: 8.0 standard drinks    Types: 5 Cans of beer, 3 Shots of liquor per week   Drug use: Yes    Types: Marijuana     Allergies   Patient has no known allergies.   Review of Systems Review of Systems  All other systems reviewed and are negative.    Physical Exam Updated Vital Signs BP (!) 143/101    Pulse 81    Temp (!) 97.5 F (36.4 C) (Oral)    Resp (!) 22    Ht 5\' 11"  (1.803 m)    Wt 108.9 kg    SpO2 100%    BMI 33.47 kg/m   Physical Exam Vitals signs and nursing note reviewed. Exam conducted with a chaperone present.  Constitutional:      Appearance: Normal appearance.  HENT:     Head: Normocephalic and atraumatic.  Eyes:     General: No scleral icterus.    Conjunctiva/sclera: Conjunctivae normal.  Pulmonary:     Effort: Pulmonary effort is normal.  Skin:    General: Skin is dry.  Neurological:     Mental Status: He is alert.     GCS: GCS eye subscore is 4. GCS  verbal subscore is 5. GCS motor subscore is 6.     Comments: Negative Dix-Hallpike test.  Negative HINTS exam.  CN II through XII grossly intact.  No weakness.  No sensory deficits.  Oriented x3. Normal gait. Normal cerebellar exam. Negative Romberg.   Psychiatric:        Mood and Affect: Mood normal.        Behavior: Behavior normal.        Thought Content: Thought content normal.      ED Treatments / Results  Labs (all labs ordered are listed, but only abnormal results are displayed) Labs Reviewed  BASIC METABOLIC PANEL  CBC  CBG MONITORING, ED    EKG EKG Interpretation  Date/Time:  Friday November 22 2018 15:13:01 EDT Ventricular Rate:  77 PR Interval:    QRS Duration: 93 QT Interval:  393 QTC Calculation: 445 R Axis:   -28 Text Interpretation: Sinus rhythm Consider left atrial enlargement Borderline left axis deviation Minimal ST elevation, inferior leads Confirmed by Lennice Sites [phone]) on 11/22/2018 3:26:18 PM   Radiology No results found.  Procedures Procedures (including critical care time)  Medications Ordered in ED Medications  sodium chloride flush (NS) 0.9 % injection 3 mL (3 mLs Intravenous Not Given 11/22/18 1514)     Initial Impression / Assessment and Plan / ED Course  I have reviewed the triage vital signs and the nursing notes.  Pertinent labs & imaging results that were available during my care of the patient were reviewed by me and considered in my medical decision making (see chart for details).        Patient's history physical exam is consistent with benign paroxysmal positional vertigo.  He denies any unilateral hearing loss or tinnitus I am not concerned for Mnire's disease.  He may need to follow-up with ENT if symptoms continue to persist to be evaluated for labyrinthitis.  I could visualize his TMs and there was no cerumen impaction or AOM that may also precipitate his dizziness symptoms.  Patient does not have any neurologic  deficits and is ambulating normally and without ataxia.  I am not concerned for a stroke and do not feel as though CT or MRI of the brain is warranted at this time.  Patient does not currently have any headache or dizziness that would require intervention.  Patient's vitals are unremarkable and his lab work was interpreted and very reassuring.  There is no elevated white count to suggest an infectious process.  He is not anemic.  There are no electrolyte abnormalities that may explain his  dizziness.  I will prescribe patient meclizine as an abortive therapy for his episodes of dizziness.  While I was unable to provoke symptoms using Dix-Hallpike assessment, recommended that patient look into the Epley maneuver for abortive treatments.  Return to the ED or seek medical attention should he develop any worsening headaches, unrelenting dizziness, chest pain or shortness of breath, uncontrolled nausea or vomiting, numbness, tingling, weakness, visual changes, or any other neurologic deficits. All of the evaluation and work-up results were discussed with the patient and any family at bedside. They were provided opportunity to ask any additional questions and have none at this time. They have expressed understanding of verbal discharge instructions as well as return precautions and are agreeable to the plan.     Final Clinical Impressions(s) / ED Diagnoses   Final diagnoses:  Benign paroxysmal positional vertigo, unspecified laterality    ED Discharge Orders         Ordered    meclizine (ANTIVERT) 12.5 MG tablet  3 times daily PRN     11/22/18 1606           Lorelee NewGreen, Dannel Rafter L, PA-C 11/22/18 1659    Virgina Norfolkuratolo, Adam, DO 11/22/18 1714

## 2018-11-22 NOTE — ED Notes (Signed)
Pt discharge instructions and perscriptions reviewed with patient. Pt verbalized understanding of all instructions. Pt discharged.

## 2019-01-16 IMAGING — DX DG CHEST 2V
2 series · 2 of 2 positions shown · non-contrast
Comparison: 01/03/2017

CLINICAL DATA: Upper chest pain 3-4 days and again this morning.

EXAM:
CHEST  2 VIEW

[chest lat]
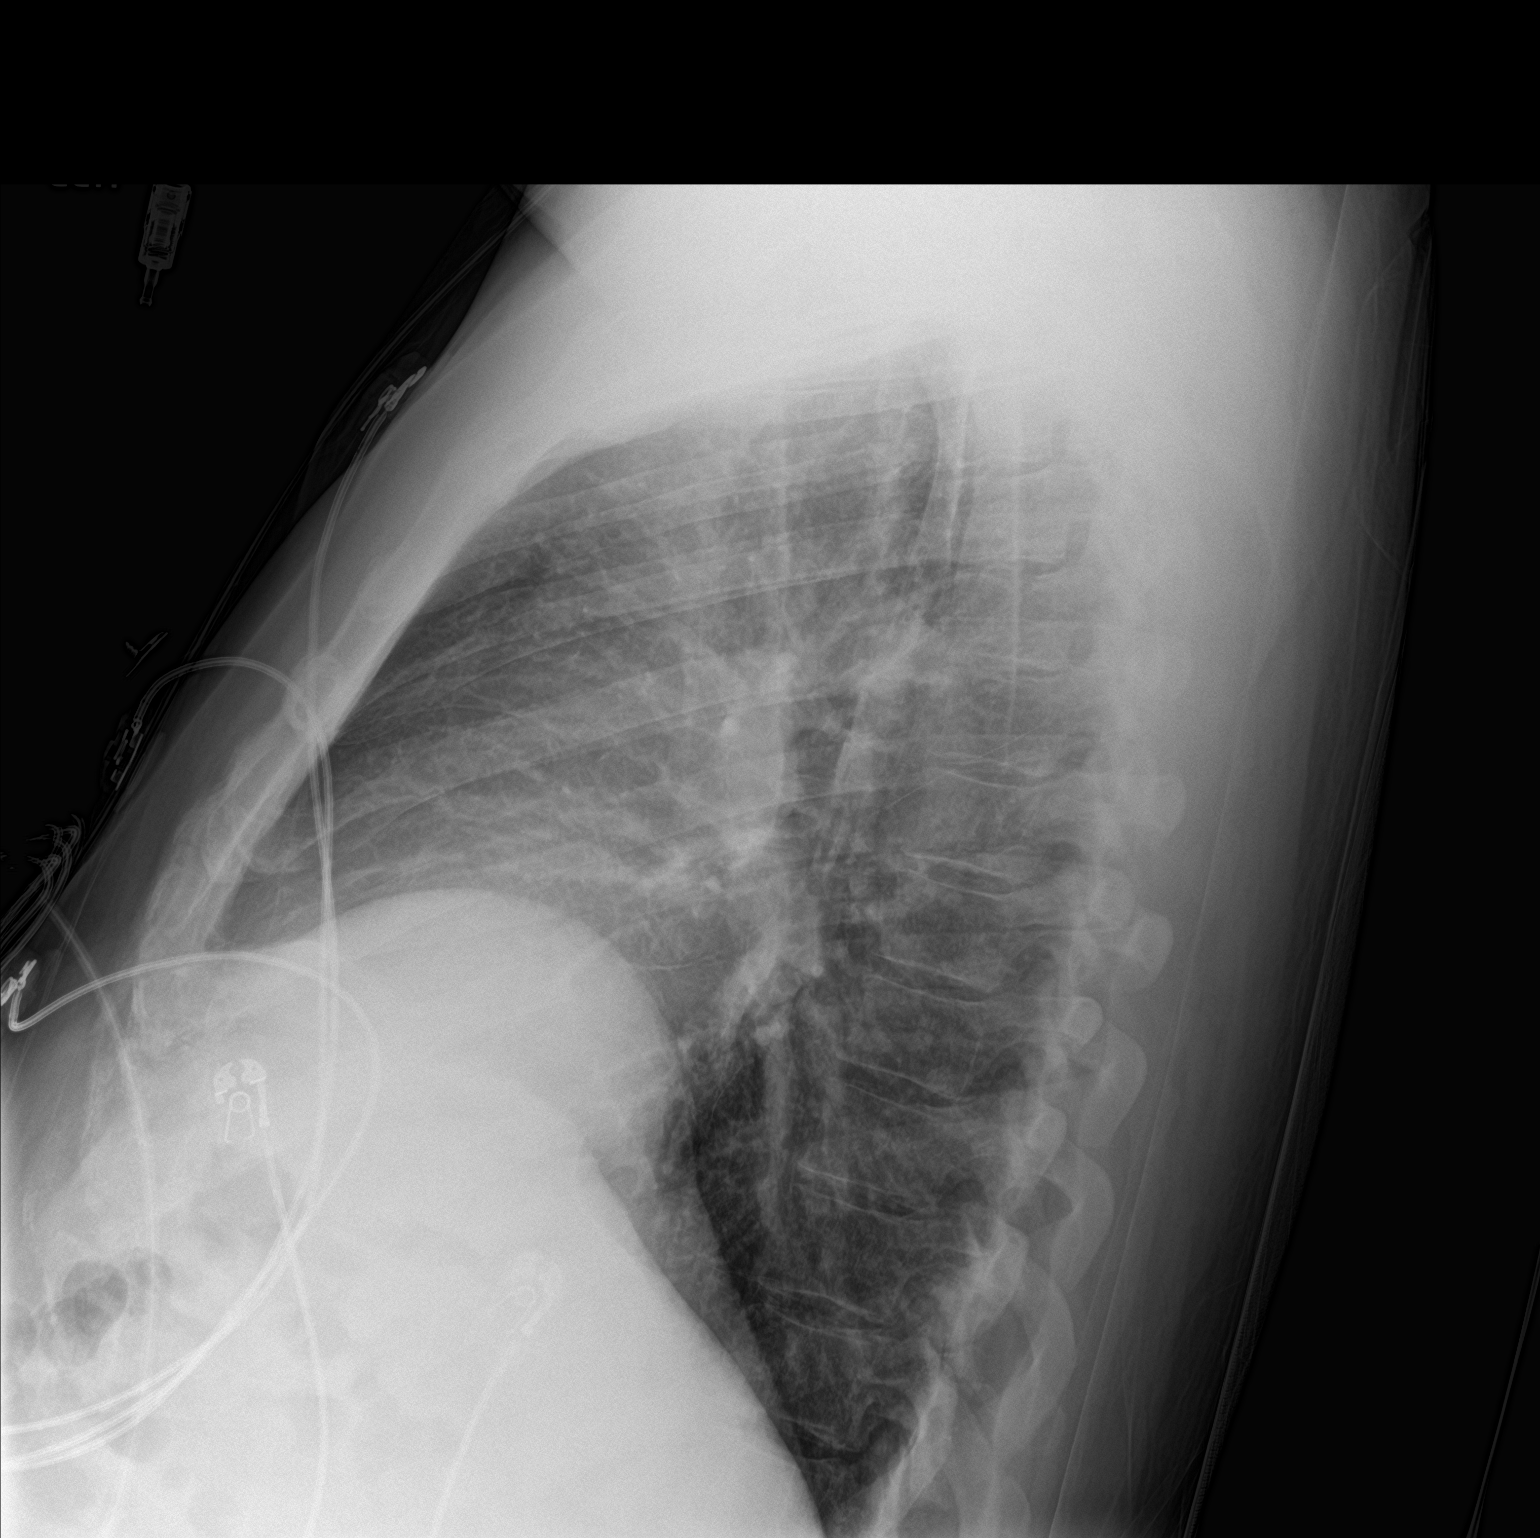

[chest ap]
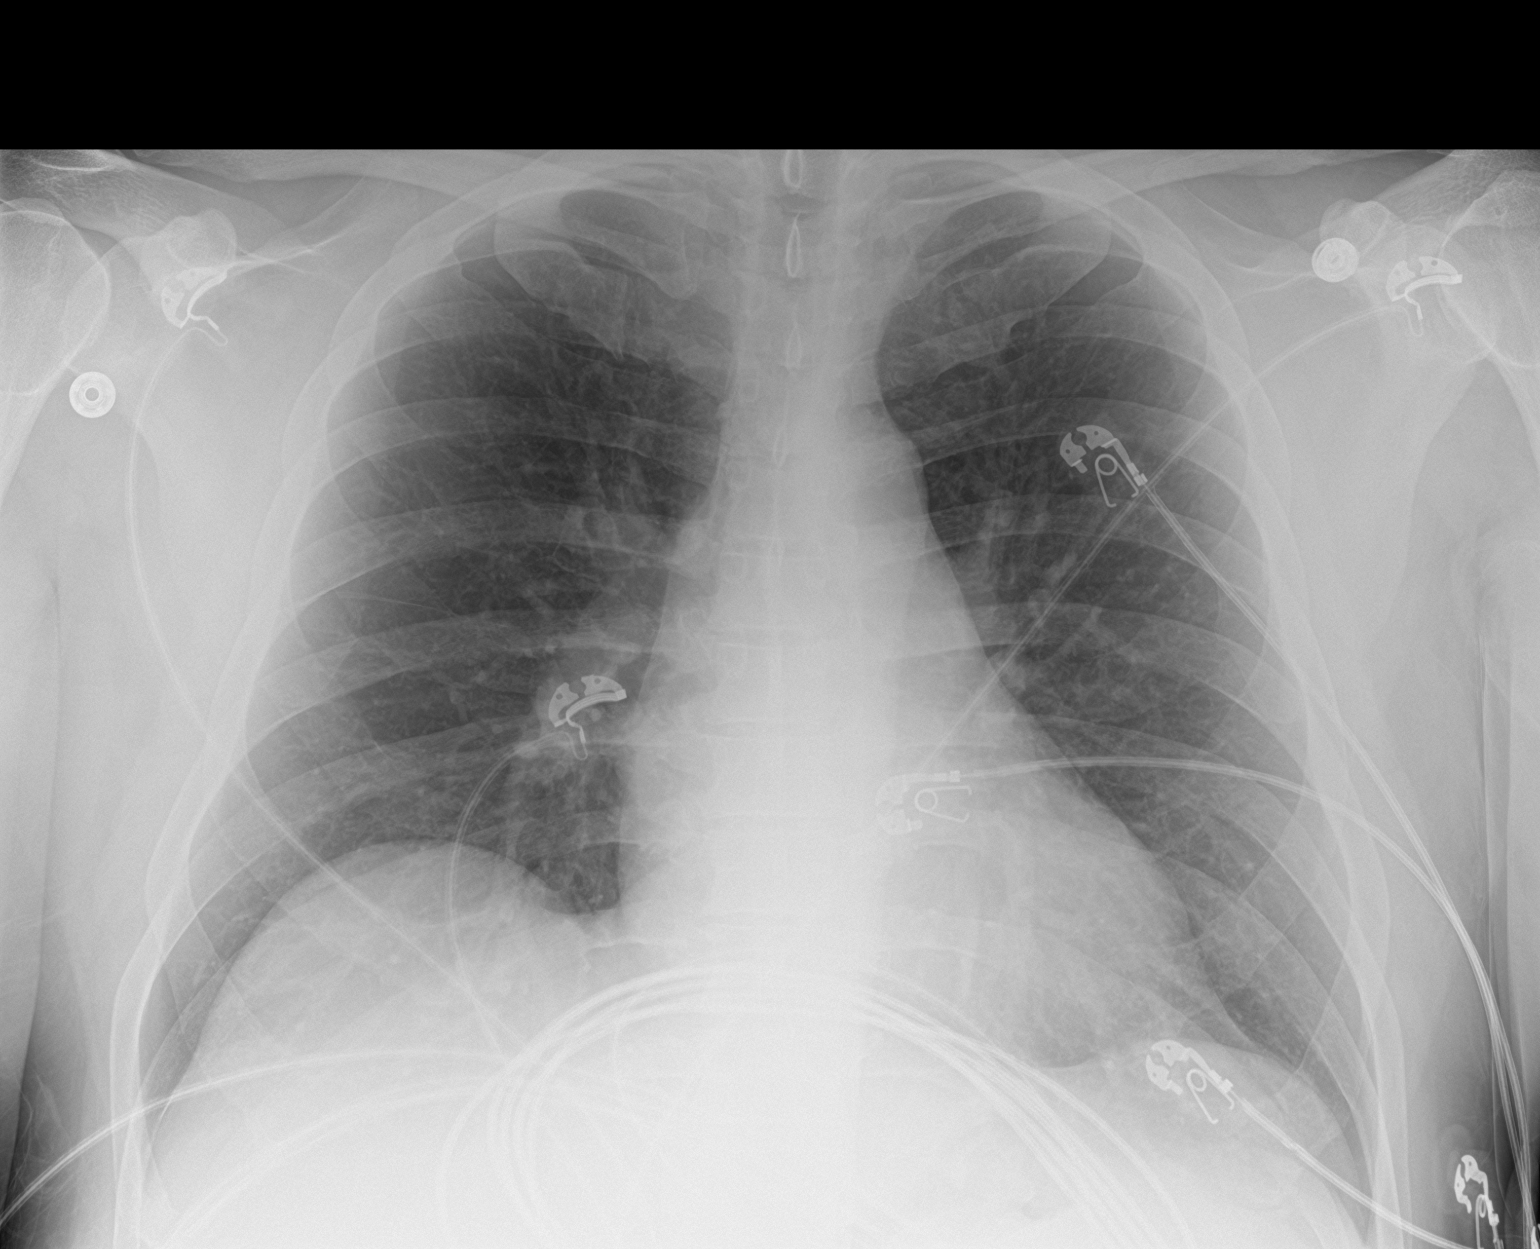

[2 of 2 positions shown; findings below may reference images not displayed]

FINDINGS: The heart size and mediastinal contours are within normal limits.
Both lungs are clear. The visualized skeletal structures are
unremarkable.
IMPRESSION: No active cardiopulmonary disease.

## 2019-01-26 ENCOUNTER — Emergency Department (HOSPITAL_COMMUNITY)
Admission: EM | Admit: 2019-01-26 | Discharge: 2019-01-26 | Discharge status: Absent without leave | Payer: BC Managed Care – PPO

## 2019-01-26 ENCOUNTER — Other Ambulatory Visit: Payer: Self-pay

## 2019-05-29 ENCOUNTER — Other Ambulatory Visit: Payer: Self-pay

## 2019-05-29 ENCOUNTER — Ambulatory Visit: Payer: BC Managed Care – PPO | Attending: Internal Medicine

## 2019-05-29 DIAGNOSIS — Z20822 Contact with and (suspected) exposure to covid-19: Secondary | ICD-10-CM

## 2019-05-30 LAB — NOVEL CORONAVIRUS, NAA: SARS-CoV-2, NAA: NOT DETECTED

## 2019-05-30 LAB — SARS-COV-2, NAA 2 DAY TAT

## 2020-05-07 ENCOUNTER — Other Ambulatory Visit: Payer: Self-pay

## 2020-05-07 ENCOUNTER — Encounter (HOSPITAL_COMMUNITY): Payer: Self-pay

## 2020-05-07 ENCOUNTER — Ambulatory Visit (HOSPITAL_COMMUNITY)
Admission: EM | Admit: 2020-05-07 | Discharge: 2020-05-07 | Disposition: A | Discharge status: Home / Self Care | Payer: No Typology Code available for payment source | Attending: Emergency Medicine | Admitting: Emergency Medicine

## 2020-05-07 DIAGNOSIS — M79605 Pain in left leg: Secondary | ICD-10-CM | POA: Insufficient documentation

## 2020-05-07 DIAGNOSIS — M5432 Sciatica, left side: Secondary | ICD-10-CM | POA: Insufficient documentation

## 2020-05-07 LAB — BASIC METABOLIC PANEL
Anion gap: 4 — ABNORMAL LOW (ref 5–15)
BUN: 11 mg/dL (ref 6–20)
CO2: 27 mmol/L (ref 22–32)
Calcium: 9.2 mg/dL (ref 8.9–10.3)
Chloride: 105 mmol/L (ref 98–111)
Creatinine, Ser: 1.01 mg/dL (ref 0.61–1.24)
GFR, Estimated: >60 mL/min (ref >60)
Glucose, Bld: 88 mg/dL (ref 70–99)
Potassium: 3.9 mmol/L (ref 3.5–5.1)
Sodium: 136 mmol/L (ref 135–145)

## 2020-05-07 MED ORDER — NAPROXEN 500 MG PO TABS: 500.0000 mg | ORAL_TABLET | Freq: Two times a day (BID) | ORAL | 0 refills | Status: DC

## 2020-05-07 MED ORDER — TIZANIDINE HCL 4 MG PO TABS: 4.0000 mg | ORAL_TABLET | Freq: Four times a day (QID) | ORAL | 0 refills | Status: DC | PRN

## 2020-05-07 MED ORDER — PREDNISONE 20 MG PO TABS: 20.0000 mg | ORAL_TABLET | Freq: Every day | ORAL | 0 refills | Status: DC

## 2020-05-07 NOTE — Discharge Instructions (Addendum)
Take 1 prednisone a day for the next 5 days. You can take the zanaflex as needed for muscle pain and spasms.   You can take the naproxen twice a day for pain once you have completed your prednisone.   Continue gentle stretching and exercises.    I would recommend taking a potassium daily or increase the amount of potassium in your diet.  We will contact you if you need any additional treatment based on your labs.   Get established and follow up with your primary care provider.

## 2020-05-07 NOTE — ED Triage Notes (Signed)
Pt presents with left leg pain x 3 months. Pt reports shooting pain, tingling  left leg and left foot. States toes left foot "lock up".

## 2020-05-07 NOTE — ED Provider Notes (Signed)
[facility]    CSN: [id number] Arrival date & time: 05/07/20  5631      History   Chief Complaint Chief Complaint  Patient presents with  . Leg Pain    HPI Troy Alvarado is a 45 y.o. male.   Patient here for evaluation of left leg pain that has been ongoing for the past 3 months.  He reports pain radiates down leg and will develop numbness and tingling in his lower leg especially.  Also reports having muscle cramping in his calf and foot.  Cramping is especially worse in the morning.  Reports pain as aching.  Has not tried any OTC medications or treatments. Denies any specific alleviating or aggravating factors.  Denies any fevers, chest pain, shortness of breath, N/V/D, numbness, tingling, weakness, abdominal pain, or headaches.   ROS: As per HPI, all other pertinent ROS negative   The history is provided by the patient.  Leg Pain   Past Medical History:  Diagnosis Date  . Chronic low back pain   . Family history of prostate cancer   . History of hyperthyroidism 2009   prior oral radioablation therapy  . Hypothyroidism    s/p ablation  . Impaired fasting blood sugar   . Obesity   . Obesity     Patient Active Problem List   Diagnosis Date Noted  . Elevated blood-pressure reading without diagnosis of hypertension 09/10/2015  . Decreased pulses in feet 09/10/2015  . Skin lesion 09/10/2015  . Proteinuria 09/10/2015  . Encounter for health maintenance examination in adult 07/30/2015  . Screening for prostate cancer 07/30/2015  . Family history of prostate cancer in father 07/30/2015  . Other specified hypothyroidism 07/30/2015  . Impaired fasting blood sugar 07/30/2015  . Obesity 07/30/2015  . Chronic low back pain 07/30/2015  . PARONYCHIA 12/01/2005    Past Surgical History:  Procedure Laterality Date  . NO PAST SURGERIES  07/2015       Home Medications    Prior to Admission medications   Medication Sig Start Date End Date Taking? Authorizing  Provider  naproxen (NAPROSYN) 500 MG tablet Take 1 tablet (500 mg total) by mouth 2 (two) times daily. 05/07/20  Yes Ivette Loyal, NP  predniSONE (DELTASONE) 20 MG tablet Take 1 tablet (20 mg total) by mouth daily. 05/07/20  Yes Ivette Loyal, NP  tiZANidine (ZANAFLEX) 4 MG tablet Take 1 tablet (4 mg total) by mouth every 6 (six) hours as needed for muscle spasms. 05/07/20  Yes Ivette Loyal, NP  cyclobenzaprine (FLEXERIL) 10 MG tablet Take 1 tablet (10 mg total) by mouth 2 (two) times daily as needed for muscle spasms. 09/24/18   Hedges, Tinnie Gens, PA-C  meclizine (ANTIVERT) 12.5 MG tablet Take 1 tablet (12.5 mg total) by mouth 3 (three) times daily as needed for dizziness. 11/22/18   Lorelee New, PA-C  oxyCODONE-acetaminophen (PERCOCET/ROXICET) 5-325 MG tablet Take 1 tablet by mouth every 6 (six) hours as needed. 09/24/18   Hedges, Tinnie Gens, PA-C  SYNTHROID 50 MCG tablet TAKE 1 TABLET BY MOUTH DAILY BEFORE BREAKFAST Patient not taking: No sig reported 10/08/15   Tysinger, Kermit Balo, PA-C    Family History Family History  Problem Relation Age of Onset  . Cirrhosis Mother        died of cirrhosis  . Alcohol abuse Mother   . Crohn's disease Mother   . Stroke Mother   . Hypertension Father   . Cancer Father  67  . Diabetes Brother   . Heart disease Neg Hx     Social History Social History   Tobacco Use  . Smoking status: Never Smoker  . Smokeless tobacco: Never Used  Substance Use Topics  . Alcohol use: Yes    Alcohol/week: 8.0 standard drinks    Types: 5 Cans of beer, 3 Shots of liquor per week  . Drug use: Yes    Types: Marijuana     Allergies   Patient has no known allergies.   Review of Systems Review of Systems  Musculoskeletal: Positive for myalgias.  All other systems reviewed and are negative.    Physical Exam Triage Vital Signs ED Triage Vitals  Enc Vitals Group     BP 05/07/20 1006 (!) 145/95     Pulse Rate 05/07/20 1006 67     Resp 05/07/20 1006  18     Temp 05/07/20 1006 97.8 F (36.6 C)     Temp Source 05/07/20 1006 Oral     SpO2 05/07/20 1006 99 %     Weight --      Height --      Head Circumference --      Peak Flow --      Pain Score 05/07/20 1004 3     Pain Loc --      Pain Edu? --      Excl. in GC? --    No data found.  Updated Vital Signs BP (!) 145/95 (BP Location: Right Arm)   Pulse 67   Temp 97.8 F (36.6 C) (Oral)   Resp 18   SpO2 99%   Visual Acuity Right Eye Distance:   Left Eye Distance:   Bilateral Distance:    Right Eye Near:   Left Eye Near:    Bilateral Near:     Physical Exam Vitals and nursing note reviewed.  Constitutional:      General: He is not in acute distress.    Appearance: Normal appearance. He is not ill-appearing, toxic-appearing or diaphoretic.  HENT:     Head: Normocephalic and atraumatic.  Eyes:     Conjunctiva/sclera: Conjunctivae normal.  Cardiovascular:     Rate and Rhythm: Normal rate.     Pulses: Normal pulses.  Pulmonary:     Effort: Pulmonary effort is normal.  Abdominal:     General: Abdomen is flat.     Tenderness: There is no right CVA tenderness or left CVA tenderness.  Musculoskeletal:     Cervical back: Normal range of motion.     Lumbar back: No swelling, deformity or bony tenderness. Normal range of motion. Positive left straight leg raise test.  Skin:    General: Skin is warm and dry.  Neurological:     General: No focal deficit present.     Mental Status: He is alert and oriented to person, place, and time.  Psychiatric:        Mood and Affect: Mood normal.      UC Treatments / Results  Labs (all labs ordered are listed, but only abnormal results are displayed) Labs Reviewed  BASIC METABOLIC PANEL    EKG   Radiology No results found.  Procedures Procedures (including critical care time)  Medications Ordered in UC Medications - No data to display  Initial Impression / Assessment and Plan / UC Course  I have reviewed the  triage vital signs and the nursing notes.  Pertinent labs & imaging results that were available during my care  of the patient were reviewed by me and considered in my medical decision making (see chart for details).     Assessment negative for red flags or concerns.  Most likely sciatica.  Prednisone daily for the next 5 days and Zanaflex as needed for muscle pain and spasms.  Once he is completed prednisone he may take naproxen twice daily as needed for pain.  Discussed using heating pads or warm compresses for comfort and continue gentle stretching and exercises.  Muscle cramping possibly related to electrolyte imbalance.  Will obtain BMP to ensure adequate potassium levels.  Recommend increasing potassium in diet but will follow-up based on lab results.  Primary care assistance started.  Recommend getting established with primary care provider for long-term management of health.   Final Clinical Impressions(s) / UC Diagnoses   Final diagnoses:  Sciatica of left side  Left leg pain     Discharge Instructions     Take 1 prednisone a day for the next 5 days. You can take the zanaflex as needed for muscle pain and spasms.   You can take the naproxen twice a day for pain once you have completed your prednisone.   Continue gentle stretching and exercises.    I would recommend taking a potassium daily or increase the amount of potassium in your diet.  We will contact you if you need any additional treatment based on your labs.   Get established and follow up with your primary care provider.     ED Prescriptions    Medication Sig Dispense Auth. Provider   predniSONE (DELTASONE) 20 MG tablet Take 1 tablet (20 mg total) by mouth daily. 5 tablet Ivette Loyal, NP   tiZANidine (ZANAFLEX) 4 MG tablet Take 1 tablet (4 mg total) by mouth every 6 (six) hours as needed for muscle spasms. 30 tablet Ivette Loyal, NP   naproxen (NAPROSYN) 500 MG tablet Take 1 tablet (500 mg total) by mouth 2  (two) times daily. 30 tablet Ivette Loyal, NP     PDMP not reviewed this encounter.   Ivette Loyal, NP 05/07/20 1110

## 2020-05-11 ENCOUNTER — Encounter: Payer: Self-pay | Admitting: *Deleted

## 2021-04-09 ENCOUNTER — Telehealth: Payer: Self-pay

## 2021-04-09 ENCOUNTER — Encounter (HOSPITAL_COMMUNITY): Payer: Self-pay

## 2021-04-09 ENCOUNTER — Emergency Department (HOSPITAL_COMMUNITY)
Admission: EM | Admit: 2021-04-09 | Discharge: 2021-04-09 | Disposition: A | Discharge status: Home / Self Care | Payer: No Typology Code available for payment source | Attending: Emergency Medicine | Admitting: Emergency Medicine

## 2021-04-09 ENCOUNTER — Other Ambulatory Visit: Payer: Self-pay

## 2021-04-09 DIAGNOSIS — E039 Hypothyroidism, unspecified: Secondary | ICD-10-CM | POA: Diagnosis not present

## 2021-04-09 DIAGNOSIS — G8929 Other chronic pain: Secondary | ICD-10-CM | POA: Insufficient documentation

## 2021-04-09 DIAGNOSIS — M79604 Pain in right leg: Secondary | ICD-10-CM | POA: Insufficient documentation

## 2021-04-09 DIAGNOSIS — M5441 Lumbago with sciatica, right side: Secondary | ICD-10-CM | POA: Insufficient documentation

## 2021-04-09 LAB — URINALYSIS, ROUTINE W REFLEX MICROSCOPIC
Bilirubin Urine: NEGATIVE
Glucose, UA: NEGATIVE mg/dL
Hgb urine dipstick: NEGATIVE
Ketones, ur: 5 mg/dL — AB
Leukocytes,Ua: NEGATIVE
Nitrite: NEGATIVE
Protein, ur: NEGATIVE mg/dL
Specific Gravity, Urine: 1.032 — ABNORMAL HIGH (ref 1.005–1.030)
pH: 6 (ref 5.0–8.0)

## 2021-04-09 LAB — CBC WITH DIFFERENTIAL/PLATELET
Abs Immature Granulocytes: 0.01 10*3/uL (ref 0.00–0.07)
Basophils Absolute: 0.1 10*3/uL (ref 0.0–0.1)
Basophils Relative: 1 %
Eosinophils Absolute: 0.2 10*3/uL (ref 0.0–0.5)
Eosinophils Relative: 3 %
HCT: 41.5 % (ref 39.0–52.0)
Hemoglobin: 13.7 g/dL (ref 13.0–17.0)
Immature Granulocytes: 0 %
Lymphocytes Relative: 33 %
Lymphs Abs: 1.8 10*3/uL (ref 0.7–4.0)
MCH: 29.2 pg (ref 26.0–34.0)
MCHC: 33 g/dL (ref 30.0–36.0)
MCV: 88.5 fL (ref 80.0–100.0)
Monocytes Absolute: 0.4 10*3/uL (ref 0.1–1.0)
Monocytes Relative: 7 %
Neutro Abs: 3 10*3/uL (ref 1.7–7.7)
Neutrophils Relative %: 56 %
Platelets: 273 10*3/uL (ref 150–400)
RBC: 4.69 MIL/uL (ref 4.22–5.81)
RDW: 13.1 % (ref 11.5–15.5)
WBC: 5.4 10*3/uL (ref 4.0–10.5)
nRBC: 0 % (ref 0.0–0.2)

## 2021-04-09 LAB — BASIC METABOLIC PANEL
Anion gap: 8 (ref 5–15)
BUN: 13 mg/dL (ref 6–20)
CO2: 26 mmol/L (ref 22–32)
Calcium: 9.3 mg/dL (ref 8.9–10.3)
Chloride: 104 mmol/L (ref 98–111)
Creatinine, Ser: 0.96 mg/dL (ref 0.61–1.24)
GFR, Estimated: >60 mL/min (ref >60)
Glucose, Bld: 98 mg/dL (ref 70–99)
Potassium: 4.3 mmol/L (ref 3.5–5.1)
Sodium: 138 mmol/L (ref 135–145)

## 2021-04-09 MED ORDER — METHOCARBAMOL 500 MG PO TABS: 500.0000 mg | ORAL_TABLET | Freq: Two times a day (BID) | ORAL | 0 refills | Status: AC

## 2021-04-09 MED ORDER — METHOCARBAMOL 500 MG PO TABS
500.0000 mg | ORAL_TABLET | Freq: Once | ORAL | Status: AC
  Administered 2021-04-09: 500 mg via ORAL
  Filled 2021-04-09: qty 1

## 2021-04-09 MED ORDER — PREDNISONE 10 MG (21) PO TBPK: ORAL_TABLET | Freq: Every day | ORAL | 0 refills | Status: DC

## 2021-04-09 MED ORDER — NAPROXEN 500 MG PO TABS: 500.0000 mg | ORAL_TABLET | Freq: Two times a day (BID) | ORAL | 0 refills | Status: AC

## 2021-04-09 MED ORDER — KETOROLAC TROMETHAMINE 30 MG/ML IJ SOLN
30.0000 mg | Freq: Once | INTRAMUSCULAR | Status: AC
  Administered 2021-04-09: 30 mg via INTRAVENOUS
  Filled 2021-04-09: qty 1

## 2021-04-09 NOTE — ED Triage Notes (Signed)
Pt c/o R sided mid back pain radiating down his R leg. Pt also reports cramping in his feet intermittently and swelling. No recent traumas noted. Pt reports he was previously diagnosed with sciatica.  ?

## 2021-04-09 NOTE — ED Notes (Signed)
Pt verbalized understanding of d/c instructions, meds and followup care. Denies questions. VSS, no distress noted. Steady gait to exit with all belongings.  ?

## 2021-04-09 NOTE — Telephone Encounter (Signed)
Patient called in and stated that he received  two medications, but did not receive prednisone, called pharmacy and left a message with this CM information to call  back. Will reach out again and call in prednisone if they do not have the e-script ?

## 2021-04-09 NOTE — ED Provider Notes (Signed)
?[facility] EMERGENCY DEPARTMENT ?Provider Note ? ? ?CSN: [id number] ?Arrival date & time: 04/09/21  3734 ? ?  ? ?History ?PMH: hypothyroidism, obesity, chronic lower back pain with sciatica ?Chief Complaint  ?Patient presents with  ? Back Pain  ? Foot Pain  ? ? ?Troy Alvarado is a 46 y.o. male.  Patient presents the emergency department with right lower back pain.  He states that the pain started about 2 to 3 days ago.  He does not remember what he was doing when it started or if it was associated with an injury.  He does not remember injuring himself.  He has not been participating in any strenuous activities lately that he can remember.  He states that he can feel a knot just right of his vertebra.  He says has been there for a while and he has had it x-rayed in the past.  Was recommended that he massage this whenever is aggravating him.  He says that the pain radiates down his leg.  He has had sciatica in the past and the radiating pain feels very similar, however the back pain is different than normal.  He denies any dysuria, hematuria, fevers, chills, saddle anesthesia, bowel or bladder dysfunction, numbness, weakness, or gait abnormalities.  He does state that he is experiencing increased cramping in his lower legs and feelings of tingling as well.  He also complains of increased feet swelling after long days at work. ? ? ?Back Pain ?Foot Pain ? ? ?  ? ?Home Medications ?Prior to Admission medications   ?Medication Sig Start Date End Date Taking? Authorizing Provider  ?methocarbamol (ROBAXIN) 500 MG tablet Take 1 tablet (500 mg total) by mouth 2 (two) times daily for 7 days. 04/09/21 04/16/21 Yes Shadell Brenn, Finis Bud, PA-C  ?naproxen (NAPROSYN) 500 MG tablet Take 1 tablet (500 mg total) by mouth 2 (two) times daily for 7 days. 04/09/21 04/16/21 Yes Dallan Schonberg, Finis Bud, PA-C  ?predniSONE (STERAPRED UNI-PAK 21 TAB) 10 MG (21) TBPK tablet Take by mouth daily. Take 6 tabs by mouth daily  for 2 days, then  5 tabs for 2 days, then 4 tabs for 2 days, then 3 tabs for 2 days, 2 tabs for 2 days, then 1 tab by mouth daily for 2 days 04/09/21  Yes Elanor Cale, Finis Bud, PA-C  ?cyclobenzaprine (FLEXERIL) 10 MG tablet Take 1 tablet (10 mg total) by mouth 2 (two) times daily as needed for muscle spasms. 09/24/18   Hedges, Tinnie Gens, PA-C  ?meclizine (ANTIVERT) 12.5 MG tablet Take 1 tablet (12.5 mg total) by mouth 3 (three) times daily as needed for dizziness. 11/22/18   Lorelee New, PA-C  ?oxyCODONE-acetaminophen (PERCOCET/ROXICET) 5-325 MG tablet Take 1 tablet by mouth every 6 (six) hours as needed. 09/24/18   Hedges, Tinnie Gens, PA-C  ?SYNTHROID 50 MCG tablet TAKE 1 TABLET BY MOUTH DAILY BEFORE BREAKFAST ?Patient not taking: No sig reported 10/08/15   Tysinger, Kermit Balo, PA-C  ?tiZANidine (ZANAFLEX) 4 MG tablet Take 1 tablet (4 mg total) by mouth every 6 (six) hours as needed for muscle spasms. 05/07/20   Ivette Loyal, NP  ?   ? ?Allergies    ?Patient has no known allergies.   ? ?Review of Systems   ?Review of Systems  ?Cardiovascular:  Positive for leg swelling.  ?Musculoskeletal:  Positive for back pain.  ?All other systems reviewed and are negative. ? ?Physical Exam ?Updated Vital Signs ?BP 129/88 (BP Location: Right Arm)   Pulse 70  Temp 98.7 ?F (37.1 ?C) (Oral)   Resp 19   SpO2 99%  ?Physical Exam ?Vitals and nursing note reviewed.  ?Constitutional:   ?   General: He is not in acute distress. ?   Appearance: Normal appearance. He is well-developed. He is not ill-appearing, toxic-appearing or diaphoretic.  ?HENT:  ?   Head: Normocephalic and atraumatic.  ?   Nose: No nasal deformity.  ?   Mouth/Throat:  ?   Lips: Pink. No lesions.  ?Eyes:  ?   General: Gaze aligned appropriately. No scleral icterus.    ?   Right eye: No discharge.     ?   Left eye: No discharge.  ?   Conjunctiva/sclera: Conjunctivae normal.  ?   Right eye: Right conjunctiva is not injected. No exudate or hemorrhage. ?   Left eye: Left conjunctiva is not  injected. No exudate or hemorrhage. ?Pulmonary:  ?   Effort: Pulmonary effort is normal. No respiratory distress.  ?Musculoskeletal:  ?   Right lower leg: No edema.  ?   Left lower leg: No edema.  ?   Comments: There is no lumbar midline tenderness to palpation or step-offs noted.  There is reproducible paraspinal muscular tenderness.  Just right of the lumbar spine is a knot.  Not feel soft.  Is not painful.  Feels like it could be a muscle spasm. ?DP/PT pulses 2+ and equal bilaterally ?No leg edema noted.  Patient endorses subjective feet swelling bilaterally, this is minimal or absent on my assessment. ?Sensation grossly intact on anterior thighs, dorsum of foot and lateral foot ?Strength of knee flexion and extension is 5/5 ?Plantar and dorsiflexion of ankle 5/5 ?Gait normal  ?Skin: ?   General: Skin is warm and dry.  ?Neurological:  ?   Mental Status: He is alert and oriented to person, place, and time.  ?Psychiatric:     ?   Mood and Affect: Mood normal.     ?   Speech: Speech normal.     ?   Behavior: Behavior normal. Behavior is cooperative.  ? ? ?ED Results / Procedures / Treatments   ?Labs ?(all labs ordered are listed, but only abnormal results are displayed) ?Labs Reviewed  ?URINALYSIS, ROUTINE W REFLEX MICROSCOPIC - Abnormal; Notable for the following components:  ?    Result Value  ? Specific Gravity, Urine 1.032 (*)   ? Ketones, ur 5 (*)   ? All other components within normal limits  ?BASIC METABOLIC PANEL  ?CBC WITH DIFFERENTIAL/PLATELET  ? ? ?EKG ?None ? ?Radiology ?No results found. ? ?Procedures ?Procedures  ? ? ?Medications Ordered in ED ?Medications  ?ketorolac (TORADOL) 30 MG/ML injection 30 mg (30 mg Intravenous Given 04/09/21 1059)  ?methocarbamol (ROBAXIN) tablet 500 mg (500 mg Oral Given 04/09/21 1059)  ? ? ?ED Course/ Medical Decision Making/ A&P ?  ?                        ?Medical Decision Making ?Amount and/or Complexity of Data Reviewed ?Labs: ordered. ? ?Risk ?Prescription drug  management. ? ? ? ?MDM  ?This is a 46 y.o. male who presents to the ED with left lower back pain and right leg cramping ? ?My Impression, Plan, and ED Course: There are no red flags present for lower back pain.  I do not think that he will require imaging at this time.  I suspect this is musculoskeletal, however will obtain screening urinalysis and cbc to  make sure this is not pyelo or any presence of crystals or profound leukocytosis.  Patient has no urinary symptoms or fevers to suggest this. ?Patient also is complaining of muscular cramps and of subjective leg swelling in his lower extremities.  I do not really appreciate the leg swelling on my exam.  I will order a BMP to assess for kidney function, and electrolyte abnormality. ?We will go ahead and treat symptoms with Toradol and Robaxin here. ? ?I personally ordered, reviewed, and interpreted all laboratory work and imaging and agree with radiologist interpretation. Results interpreted below: BMP without kidney or electrolyte dysfunction. CBC without anemia or leukocytosis. Urine without blood or sx of infection. ? ?Work up largely unremarkable. Symptoms have improved with medical therapy. I think that this is likely acute exasperation of patient's chronic lower back pain and sciatica. Recommend steroid taper, antiinflammatories, and muscle relaxers. Will refer to PCP.  ? ? ? ? ?Charting Requirements ?Additional history is obtained from:  Independent historian ?External Records from outside source obtained and reviewed including: Old ED notes for same issue ?Social Determinants of Health:  Access to medical care ?Pertinant PMH that complicates patient's illness: sciatica ? ?Patient Care ?Problems that were addressed during this visit: ?- right lower back pain with sciatica: Chronic illness with exacerbation, progression, or side effects of treatment ?Medications given in ED: Toradol, robaxin ?Reevaluation of the patient after these medicines showed that the  patient improved ?Disposition: supportive tx, establish with pcp ? ?This is was an independent visit. My attending physician was immediately available if needed. ? ?Portions of this note were generated with Dragon dicta

## 2021-04-09 NOTE — Discharge Instructions (Signed)
I have prescribed you three medications to pick up at pharmacy. ?- Robaxin is a muscle relaxer that you can take as needed for pain. This may make you drowsy, so do not take prior to driving or operating heavy machinery ?- Naproxen is twice a day for one week. This is an antiinflammatory that will help with pain as well as reduce inflammation ?- Prednisone is a steroid that will help treat. Take this as prescribed. The dose changes every day so please read the instructions carefully.  ? ?Please call [facility] and set up an appointment to establish care with a primary care doctor. It may take a while to get an appointment, but it would be a good idea to start seeing somebody regularly to manage your chronic conditions.  ?

## 2021-05-16 LAB — HM COLONOSCOPY

## 2022-03-09 ENCOUNTER — Ambulatory Visit (HOSPITAL_COMMUNITY)
Admission: EM | Admit: 2022-03-09 | Discharge: 2022-03-09 | Disposition: A | Discharge status: Home / Self Care | Payer: No Typology Code available for payment source | Attending: Family Medicine | Admitting: Family Medicine

## 2022-03-09 ENCOUNTER — Encounter (HOSPITAL_COMMUNITY): Payer: Self-pay | Admitting: Emergency Medicine

## 2022-03-09 ENCOUNTER — Ambulatory Visit (INDEPENDENT_AMBULATORY_CARE_PROVIDER_SITE_OTHER): Payer: No Typology Code available for payment source

## 2022-03-09 DIAGNOSIS — R079 Chest pain, unspecified: Secondary | ICD-10-CM

## 2022-03-09 DIAGNOSIS — R252 Cramp and spasm: Secondary | ICD-10-CM | POA: Insufficient documentation

## 2022-03-09 DIAGNOSIS — M940 Chondrocostal junction syndrome [Tietze]: Secondary | ICD-10-CM | POA: Insufficient documentation

## 2022-03-09 LAB — BASIC METABOLIC PANEL
Anion gap: 10 (ref 5–15)
BUN: 14 mg/dL (ref 6–20)
CO2: 26 mmol/L (ref 22–32)
Calcium: 9.3 mg/dL (ref 8.9–10.3)
Chloride: 100 mmol/L (ref 98–111)
Creatinine, Ser: 1 mg/dL (ref 0.61–1.24)
GFR, Estimated: >60 mL/min (ref >60)
Glucose, Bld: 75 mg/dL (ref 70–99)
Potassium: 4.3 mmol/L (ref 3.5–5.1)
Sodium: 136 mmol/L (ref 135–145)

## 2022-03-09 LAB — MAGNESIUM: Magnesium: 2.3 mg/dL (ref 1.7–2.4)

## 2022-03-09 MED ORDER — DICLOFENAC SODIUM 75 MG PO TBEC: 75.0000 mg | DELAYED_RELEASE_TABLET | Freq: Two times a day (BID) | ORAL | 0 refills | Status: DC

## 2022-03-09 MED ORDER — PREDNISONE 20 MG PO TABS: 40.0000 mg | ORAL_TABLET | Freq: Every day | ORAL | 0 refills | Status: DC

## 2022-03-09 NOTE — Discharge Instructions (Addendum)
You have been seen at the [facility] today for chest pain. Your evaluation today was not suggestive of any emergent condition requiring medical intervention at this time. Your chest x-ray and ECG (heart tracing) did not show any worrisome changes. However, some medical problems make take more time to appear. Therefore, it's very important that you pay attention to any new symptoms or worsening of your current condition.  Please proceed directly to the Emergency Department immediately should you feel worse in any way or have any of the following symptoms: increasing or different chest pain, pain that spreads to your arm, neck, jaw, back or abdomen, shortness of breath, or nausea and vomiting.  You have had labs (blood work) sent today. We will call you with any significant abnormalities or if there is need to begin or change treatment or pursue further follow up.  You may also review your test results online through [place]. If you do not have a MyChart account, instructions to sign up should be on your discharge paperwork.

## 2022-03-09 NOTE — ED Triage Notes (Signed)
Pt c/o central chest pain. Taking deep breaths and movement makes pain worse.   Reports for months has right leg swelling and toes will cramp up.

## 2022-03-09 NOTE — ED Provider Notes (Signed)
[place]   [id number] 03/09/22 Arrival Time: [id number]  ASSESSMENT & PLAN:  1. Chest pain, unspecified type   2. Costochondritis   3. Muscle cramps     Patient history and exam consistent with non-cardiac cause of chest pain.  ECG: Performed today and interpreted by me: normal EKG, normal sinus rhythm. No STEMI.  I have personally viewed the imaging studies ordered this visit. No acute changes on CXR. No pneumothorax.   Meds ordered this encounter  Medications   diclofenac (VOLTAREN) 75 MG EC tablet    Sig: Take 1 tablet (75 mg total) by mouth 2 (two) times daily.    Dispense:  14 tablet    Refill:  0   predniSONE (DELTASONE) 20 MG tablet    Sig: Take 2 tablets (40 mg total) by mouth daily.    Dispense:  10 tablet    Refill:  0   Chest pain precautions given.  Pending labs secondary to long-standing muscle cramping of lower extremities: Labs Reviewed  BASIC METABOLIC PANEL  MAGNESIUM  CALCIUM, IONIZED  VITAMIN D 25 HYDROXY (VIT D DEFICIENCY, FRACTURES)      Discharge Instructions      You have been seen at the [facility] today for chest pain. Your evaluation today was not suggestive of any emergent condition requiring medical intervention at this time. Your chest x-ray and ECG (heart tracing) did not show any worrisome changes. However, some medical problems make take more time to appear. Therefore, it's very important that you pay attention to any new symptoms or worsening of your current condition.  Please proceed directly to the Emergency Department immediately should you feel worse in any way or have any of the following symptoms: increasing or different chest pain, pain that spreads to your arm, neck, jaw, back or abdomen, shortness of breath, or nausea and vomiting.  You have had labs (blood work) sent today. We will call you with any significant abnormalities or if there is need to begin or change treatment or pursue further follow  up.  You may also review your test results online through [place]. If you do not have a MyChart account, instructions to sign up should be on your discharge paperwork.      Reviewed expectations re: course of current medical issues. Questions answered. Outlined signs and symptoms indicating need for more acute intervention. Patient verbalized understanding. After Visit Summary given.   SUBJECTIVE:  History from: patient. Troy Alvarado is a 47 y.o. male who presents with complaint of chest pain; sternal; x sev weeks but h/o similar in the past. Lifts heavy items at work and questions relation. No assoc SOB/n/v/diaphoresis. Pain is sharp and is mostly related to certain torso movements. Also complains about bilateral (but R>L) LE cramping at times; "my toes ball up with the feeling"; this is long-standing problem; several months; random/sporadic. Questions more at night. Questions of RLE is larger than LLE. No current leg pain. No tx PTA. Smokes THC regularly.  Social History   Tobacco Use  Smoking Status Never  Smokeless Tobacco Never   Social History   Substance and Sexual Activity  Alcohol Use Yes   Alcohol/week: 8.0 standard drinks of alcohol   Types: 5 Cans of beer, 3 Shots of liquor per week   OBJECTIVE:  Vitals:   03/09/22 0956  BP: 128/78  Pulse: 93  Resp: 16  Temp: 98.5 F (36.9 C)  SpO2: 99%    General appearance: alert, oriented, no acute distress  Eyes: PERRLA; EOMI; conjunctivae normal HENT: normocephalic; atraumatic Neck: supple with FROM Lungs: without labored respirations; speaks full sentences without difficulty; CTAB Heart: regular rate and rhythm without murmer Chest Wall: with L sternal border tenderness to palpation Abdomen: soft, non-tender; no guarding or rebound tenderness Extremities: without edema; without calf swelling or tenderness; symmetrical without gross deformities Skin: warm and dry; without rash or lesions Neuro: normal  gait Psychological: alert and cooperative; normal mood and affect  Labs:  Labs Reviewed  BASIC METABOLIC PANEL  MAGNESIUM  CALCIUM, IONIZED  VITAMIN D 25 HYDROXY (VIT D DEFICIENCY, FRACTURES)    Imaging: DG Chest 2 View  Result Date: 03/09/2022 CLINICAL DATA:  Central chest pain. EXAM: CHEST - 2 VIEW COMPARISON:  Chest radiograph 03/10/2017 FINDINGS: The cardiomediastinal silhouette is normal. There is no focal consolidation or pulmonary edema. There is no pleural effusion or pneumothorax There is no acute osseous abnormality. IMPRESSION: No radiographic evidence of acute cardiopulmonary process. Electronically Signed   By: Valetta Mole M.D.   On: 03/09/2022 11:51     No Known Allergies  Past Medical History:  Diagnosis Date   Chronic low back pain    Family history of prostate cancer    History of hyperthyroidism 2009   prior oral radioablation therapy   Hypothyroidism    s/p ablation   Impaired fasting blood sugar    Obesity    Obesity    Social History   Socioeconomic History   Marital status: Married    Spouse name: Not on file   Number of children: Not on file   Years of education: Not on file   Highest education level: Not on file  Occupational History   Occupation: warehouse work/distribution    Employer: [employer]  Tobacco Use   Smoking status: Never   Smokeless tobacco: Never  Substance and Sexual Activity   Alcohol use: Yes    Alcohol/week: 8.0 standard drinks of alcohol    Types: 5 Cans of beer, 3 Shots of liquor per week   Drug use: Yes    Types: Marijuana   Sexual activity: Not on file  Other Topics Concern   Not on file  Social History Narrative   Married, 2 children, 2 girls.    Walks for exercise on the job.  Works in Investment banker, corporate, Chief Executive Officer.   As of 07/2015.    Social Determinants of Health   Financial Resource Strain: Not on file  Food Insecurity: Not on file  Transportation Needs: Not on file  Physical Activity: Not on file  Stress:  Not on file  Social Connections: Not on file  Intimate Partner Violence: Not on file   Family History  Problem Relation Age of Onset   Cirrhosis Mother        died of cirrhosis   Alcohol abuse Mother    Crohn's disease Mother    Stroke Mother    Hypertension Father    Cancer Father        47   Diabetes Brother    Heart disease Neg Hx    Past Surgical History:  Procedure Laterality Date   NO PAST SURGERIES  07/2015      Vanessa Kick, MD 03/09/22 [phone]

## 2022-03-09 NOTE — ED Notes (Signed)
EKG given to Dr Mannie Stabile, verally instructed that patient can wait in lobby for room to come available.   When this RN making patient aware that can get registered and wait for an available room back in lobby, Pt adds that his wife found a little knot on his chest that he would like looked at as well today. This RN informed patient to make provider aware when gets to a treatment room so that can assess it.

## 2022-03-10 LAB — CALCIUM, IONIZED: Calcium, Ionized, Serum: 4.9 mg/dL (ref 4.5–5.6)

## 2022-03-11 LAB — MISC LABCORP TEST (SEND OUT): Labcorp test code: 81950

## 2022-04-20 ENCOUNTER — Ambulatory Visit
Admission: EM | Admit: 2022-04-20 | Discharge: 2022-04-20 | Disposition: A | Discharge status: Home / Self Care | Payer: No Typology Code available for payment source | Attending: Physician Assistant | Admitting: Physician Assistant

## 2022-04-20 DIAGNOSIS — M545 Low back pain, unspecified: Secondary | ICD-10-CM | POA: Diagnosis not present

## 2022-04-20 DIAGNOSIS — R252 Cramp and spasm: Secondary | ICD-10-CM | POA: Diagnosis not present

## 2022-04-20 MED ORDER — MENS 50+ MULTIVITAMIN PO TABS: 1.0000 | ORAL_TABLET | Freq: Every day | ORAL | 3 refills | Status: AC

## 2022-04-20 MED ORDER — NAPROXEN SODIUM 550 MG PO TABS: 550.0000 mg | ORAL_TABLET | Freq: Two times a day (BID) | ORAL | 0 refills | Status: DC

## 2022-04-20 NOTE — Discharge Instructions (Addendum)
Advised take the Anaprox DS 550 mg every 12 hours with food to help decrease lower back pain.  Advised to take the multivitamin, 1 tablet daily to see if this will help reduce the cramping of the feet and the toes.  Advised follow-up PCP at the appointment time in April.  Return to urgent care as needed.

## 2022-04-20 NOTE — ED Triage Notes (Signed)
[facility] with [facility] of right sided back pain that radiates down his right leg since monday Pt denies any dysuria. Pt reports no oct meds.

## 2022-04-20 NOTE — ED Provider Notes (Signed)
[facility]    CSN: [id number] Arrival date & time: 04/20/22  1334      History   Chief Complaint Chief Complaint  Patient presents with   Back Pain    HPI Troy Alvarado is a 47 y.o. male.   47 year old male presents with lower back pain right side, swelling of the legs, cramping of the toes.  Patient indicates for the past week he has been having some right lower back pain and discomfort which is worse with bending, turning and reaching.  He indicates he does a lot of lifting at his work but he does not recall injuring his back.  He indicates the pain is localized and does not travel.  He also indicates having lower part of his legs he believes to be swelling or enlarging bilaterally.  He indicates he is not injured the legs, no shortness of breath, fever, or chills.  The patient also indicates that he has been having intermittent but progressive cramping of the toes of the feet alternately and intermittently.  Patient does indicate that he smokes marijuana on a regular basis.  He indicates that he has been drinking alcohol a little bit more heavily than usual and he has recently cut down on consumption.  He is tolerating fluids well. Patient indicates he does have an appointment to become established with a family practice physician in the middle of April, 2024.   Back Pain   Past Medical History:  Diagnosis Date   Chronic low back pain    Family history of prostate cancer    History of hyperthyroidism 2009   prior oral radioablation therapy   Hypothyroidism    s/p ablation   Impaired fasting blood sugar    Obesity    Obesity     Patient Active Problem List   Diagnosis Date Noted   Elevated blood-pressure reading without diagnosis of hypertension 09/10/2015   Decreased pulses in feet 09/10/2015   Skin lesion 09/10/2015   Proteinuria 09/10/2015   Encounter for health maintenance examination in adult 07/30/2015   Screening for prostate cancer 07/30/2015    Family history of prostate cancer in father 07/30/2015   Other specified hypothyroidism 07/30/2015   Impaired fasting blood sugar 07/30/2015   Obesity 07/30/2015   Chronic low back pain 07/30/2015   PARONYCHIA 12/01/2005    Past Surgical History:  Procedure Laterality Date   NO PAST SURGERIES  07/2015       Home Medications    Prior to Admission medications   Medication Sig Start Date End Date Taking? Authorizing Provider  Multiple Vitamins-Minerals (MENS 50+ MULTIVITAMIN) TABS Take 1 tablet by mouth daily. 04/20/22  Yes Nyoka Lint, PA-C  naproxen sodium (ANAPROX DS) 550 MG tablet Take 1 tablet (550 mg total) by mouth 2 (two) times daily with a meal. For back pain. 04/20/22  Yes Nyoka Lint, PA-C  predniSONE (DELTASONE) 20 MG tablet Take 2 tablets (40 mg total) by mouth daily. 03/09/22   Vanessa Kick, MD    Family History Family History  Problem Relation Age of Onset   Cirrhosis Mother        died of cirrhosis   Alcohol abuse Mother    Crohn's disease Mother    Stroke Mother    Hypertension Father    Cancer Father        46   Diabetes Brother    Heart disease Neg Hx     Social History Social History   Tobacco Use   Smoking  status: Never   Smokeless tobacco: Never  Substance Use Topics   Alcohol use: Yes    Alcohol/week: 8.0 standard drinks of alcohol    Types: 5 Cans of beer, 3 Shots of liquor per week   Drug use: Yes    Types: Marijuana     Allergies   Patient has no known allergies.   Review of Systems Review of Systems  Musculoskeletal:  Positive for back pain (lower right).     Physical Exam Triage Vital Signs ED Triage Vitals  Enc Vitals Group     BP 04/20/22 1351 (!) 162/98     Pulse Rate 04/20/22 1351 78     Resp 04/20/22 1351 19     Temp 04/20/22 1351 98.1 F (36.7 C)     Temp src --      SpO2 04/20/22 1351 96 %     Weight --      Height --      Head Circumference --      Peak Flow --      Pain Score 04/20/22 1350 5     Pain Loc  --      Pain Edu? --      Excl. in [place]? --    No data found.  Updated Vital Signs BP (!) 162/98   Pulse 78   Temp 98.1 F (36.7 C)   Resp 19   SpO2 96%   Visual Acuity Right Eye Distance:   Left Eye Distance:   Bilateral Distance:    Right Eye Near:   Left Eye Near:    Bilateral Near:     Physical Exam Constitutional:      Appearance: Normal appearance.  Musculoskeletal:       Back:     Comments: Back: Pain is palpated along the right lower L3-L5 paraspinous area without any unusual swelling or redness of the tissue.  Range of motion is limited with pain on bending and twisting.  Negative straight leg raise bilaterally.  Bilateral lower legs: Each lower leg looks symmetrical without any unusual swelling, both legs are muscular, no unusual rashes or lesions present.  Bilateral feet: Feet appear normal but there is a presence of unusual cramping of the toes while the exam room is lasting for several seconds and resolving.  Neurological:     Mental Status: He is alert.      UC Treatments / Results  Labs (all labs ordered are listed, but only abnormal results are displayed) Labs Reviewed - No data to display  EKG   Radiology No results found.  Procedures Procedures (including critical care time)  Medications Ordered in UC Medications - No data to display  Initial Impression / Assessment and Plan / UC Course  I have reviewed the triage vital signs and the nursing notes.  Pertinent labs & imaging results that were available during my care of the patient were reviewed by me and considered in my medical decision making (see chart for details).    Plan: The diagnosis of be treated with the following: 1.  Low back pain right side without sciatica: A.  Advised take Anaprox DS every 12 hours with food to help reduce pain and swelling. 2.  Cramping of feet: A.  Advised take the multivitamin daily to help to see if this will reduce pain and cramping. 3.  Advised  to keep the appointment with family practice office to evaluate for lower leg feet cramping. 4.  Advised return to urgent care as  needed.  Final Clinical Impressions(s) / UC Diagnoses   Final diagnoses:  Acute right-sided low back pain without sciatica  Cramping of feet     Discharge Instructions      Advised take the Anaprox DS 550 mg every 12 hours with food to help decrease lower back pain.  Advised to take the multivitamin, 1 tablet daily to see if this will help reduce the cramping of the feet and the toes.  Advised follow-up PCP at the appointment time in April.  Return to urgent care as needed.    ED Prescriptions     Medication Sig Dispense Auth. Provider   naproxen sodium (ANAPROX DS) 550 MG tablet Take 1 tablet (550 mg total) by mouth 2 (two) times daily with a meal. For back pain. 20 tablet Nyoka Lint, PA-C   Multiple Vitamins-Minerals (MENS 50+ MULTIVITAMIN) TABS Take 1 tablet by mouth daily. 30 tablet Nyoka Lint, PA-C      PDMP not reviewed this encounter.   Nyoka Lint, PA-C 04/20/22 [phone]

## 2022-04-26 ENCOUNTER — Ambulatory Visit (INDEPENDENT_AMBULATORY_CARE_PROVIDER_SITE_OTHER): Payer: No Typology Code available for payment source | Admitting: Nurse Practitioner

## 2022-04-26 ENCOUNTER — Encounter: Payer: Self-pay | Admitting: Nurse Practitioner

## 2022-04-26 VITALS — BP 138/75 | HR 76 | Temp 96.8°F | Ht 71.0 in | Wt 247.6 lb

## 2022-04-26 DIAGNOSIS — Z0000 Encounter for general adult medical examination without abnormal findings: Secondary | ICD-10-CM | POA: Insufficient documentation

## 2022-04-26 DIAGNOSIS — Z1329 Encounter for screening for other suspected endocrine disorder: Secondary | ICD-10-CM

## 2022-04-26 DIAGNOSIS — G8929 Other chronic pain: Secondary | ICD-10-CM

## 2022-04-26 DIAGNOSIS — E559 Vitamin D deficiency, unspecified: Secondary | ICD-10-CM

## 2022-04-26 DIAGNOSIS — R0681 Apnea, not elsewhere classified: Secondary | ICD-10-CM

## 2022-04-26 DIAGNOSIS — R600 Localized edema: Secondary | ICD-10-CM

## 2022-04-26 DIAGNOSIS — M545 Low back pain, unspecified: Secondary | ICD-10-CM | POA: Diagnosis not present

## 2022-04-26 DIAGNOSIS — E538 Deficiency of other specified B group vitamins: Secondary | ICD-10-CM | POA: Diagnosis not present

## 2022-04-26 DIAGNOSIS — Z125 Encounter for screening for malignant neoplasm of prostate: Secondary | ICD-10-CM | POA: Diagnosis not present

## 2022-04-26 DIAGNOSIS — M79604 Pain in right leg: Secondary | ICD-10-CM

## 2022-04-26 DIAGNOSIS — Z131 Encounter for screening for diabetes mellitus: Secondary | ICD-10-CM

## 2022-04-26 DIAGNOSIS — M79605 Pain in left leg: Secondary | ICD-10-CM

## 2022-04-26 LAB — POCT URINALYSIS DIP (CLINITEK)
Bilirubin, UA: NEGATIVE
Blood, UA: NEGATIVE
Glucose, UA: NEGATIVE mg/dL
Ketones, POC UA: NEGATIVE mg/dL
Leukocytes, UA: NEGATIVE
Nitrite, UA: NEGATIVE
POC PROTEIN,UA: NEGATIVE
Spec Grav, UA: 1.025 (ref 1.010–1.025)
Urobilinogen, UA: 0.2 E.U./dL
pH, UA: 7 (ref 5.0–8.0)

## 2022-04-26 MED ORDER — TIZANIDINE HCL 4 MG PO TABS: 4.0000 mg | ORAL_TABLET | Freq: Four times a day (QID) | ORAL | 0 refills | Status: DC | PRN

## 2022-04-26 NOTE — Progress Notes (Signed)
@Patient  ID: Troy Alvarado, male    DOB: December 11, 1975, 47 y.o.   MRN: [id number]  Chief Complaint  Patient presents with   Establish Care    Non fasting    Referring provider: No ref. provider found   HPI  Patient presents today to establish care.  He has not had a PCP in the past several years.  He does have numerous complaints today.  He states that he does have right-sided low back pain.  He does have to do heavy lifting on his job.  He also has right side lower extremity pain and edema.  He does have bilateral lower extremity pain worse at night.  Patient does have severe leg cramping and foot cramping at night.  He states that his wife did notice apneic episodes at night and would like for him to have a sleep study.  Patient states that he does smoke marijuana.Denies f/c/s, n/v/d, hemoptysis, PND, leg swelling Denies chest pain or edema     No Known Allergies  Immunization History  Administered Date(s) Administered   Influenza,inj,Quad PF,6+ Mos 10/24/2013, 12/06/2017   Tdap 09/24/2013    Past Medical History:  Diagnosis Date   Chronic low back pain    Family history of prostate cancer    History of hyperthyroidism 2009   prior oral radioablation therapy   Hypothyroidism    s/p ablation   Impaired fasting blood sugar    Obesity    Obesity     Tobacco History: Social History   Tobacco Use  Smoking Status Never  Smokeless Tobacco Never   Counseling given: Not Answered   Outpatient Encounter Medications as of 04/26/2022  Medication Sig   Multiple Vitamins-Minerals (MENS 50+ MULTIVITAMIN) TABS Take 1 tablet by mouth daily.   naproxen sodium (ANAPROX DS) 550 MG tablet Take 1 tablet (550 mg total) by mouth 2 (two) times daily with a meal. For back pain.   tiZANidine (ZANAFLEX) 4 MG tablet Take 1 tablet (4 mg total) by mouth every 6 (six) hours as needed for muscle spasms.   predniSONE (DELTASONE) 20 MG tablet Take 2 tablets (40 mg total) by mouth daily. (Patient  not taking: Reported on 04/26/2022)   No facility-administered encounter medications on file as of 04/26/2022.     Review of Systems  Review of Systems  Constitutional: Negative.   HENT: Negative.    Cardiovascular:  Positive for leg swelling.  Gastrointestinal: Negative.   Musculoskeletal:  Positive for back pain.  Allergic/Immunologic: Negative.   Neurological: Negative.   Psychiatric/Behavioral: Negative.         Physical Exam  BP 138/75   Pulse 76   Temp (!) 96.8 F (36 C)   Ht 5\' 11"  (1.803 m)   Wt 247 lb 9.6 oz (112.3 kg)   SpO2 98%   BMI 34.53 kg/m   Wt Readings from Last 5 Encounters:  04/26/22 247 lb 9.6 oz (112.3 kg)  11/22/18 240 lb (108.9 kg)  03/10/17 245 lb (111.1 kg)  01/03/17 240 lb (108.9 kg)  02/21/16 230 lb (104.3 kg)     Physical Exam Vitals and nursing note reviewed.  Constitutional:      General: He is not in acute distress.    Appearance: He is well-developed.  Cardiovascular:     Rate and Rhythm: Normal rate and regular rhythm.  Pulmonary:     Effort: Pulmonary effort is normal.     Breath sounds: Normal breath sounds.  Musculoskeletal:     Right lower  leg: Edema present.  Skin:    General: Skin is warm and dry.  Neurological:     Mental Status: He is alert and oriented to person, place, and time.  Psychiatric:        Mood and Affect: Mood normal.        Behavior: Behavior normal.      Lab Results:  CBC    Component Value Date/Time   WBC 5.4 04/09/2021 1023   RBC 4.69 04/09/2021 1023   HGB 13.7 04/09/2021 1023   HCT 41.5 04/09/2021 1023   PLT 273 04/09/2021 1023   MCV 88.5 04/09/2021 1023   MCH 29.2 04/09/2021 1023   MCHC 33.0 04/09/2021 1023   RDW 13.1 04/09/2021 1023   LYMPHSABS 1.8 04/09/2021 1023   MONOABS 0.4 04/09/2021 1023   EOSABS 0.2 04/09/2021 1023   BASOSABS 0.1 04/09/2021 1023    BMET    Component Value Date/Time   NA 136 03/09/2022 1543   K 4.3 03/09/2022 1543   CL 100 03/09/2022 1543   CO2 26  03/09/2022 1543   GLUCOSE 75 03/09/2022 1543   BUN 14 03/09/2022 1543   CREATININE 1.00 03/09/2022 1543   CREATININE 0.96 07/30/2015 0916   CALCIUM 9.3 03/09/2022 1543   GFRNONAA >60 03/09/2022 1543   GFRAA >60 11/22/2018 1146    BNP No results found for: "BNP"  ProBNP No results found for: "PROBNP"  Imaging: No results found.   Assessment & Plan:   Routine adult health maintenance - CBC - Comprehensive metabolic panel  2. Prostate cancer screening  - PSA  3. Thyroid disorder screen  - TSH  4. Vitamin B12 deficiency  - Vitamin B12  5. Vitamin D deficiency  - Vitamin D, 25-hydroxy  6. Diabetes mellitus screening  - Hemoglobin A1c  7. Witnessed apneic spells  - Ambulatory referral to Pulmonology  8. Chronic right-sided low back pain without sciatica  - POCT URINALYSIS DIP (CLINITEK)  9. Pain in both lower extremities   10. Leg edema, right  - Ambulatory referral to Vascular Surgery  Follow up:  Follow up in 3 months     Fenton Foy, NP 04/26/2022

## 2022-04-26 NOTE — Assessment & Plan Note (Signed)
-   CBC - Comprehensive metabolic panel  2. Prostate cancer screening  - PSA  3. Thyroid disorder screen  - TSH  4. Vitamin B12 deficiency  - Vitamin B12  5. Vitamin D deficiency  - Vitamin D, 25-hydroxy  6. Diabetes mellitus screening  - Hemoglobin A1c  7. Witnessed apneic spells  - Ambulatory referral to Pulmonology  8. Chronic right-sided low back pain without sciatica  - POCT URINALYSIS DIP (CLINITEK)  9. Pain in both lower extremities   10. Leg edema, right  - Ambulatory referral to Vascular Surgery  Follow up:  Follow up in 3 months

## 2022-04-26 NOTE — Patient Instructions (Addendum)
1. Routine adult health maintenance  - CBC - Comprehensive metabolic panel  2. Prostate cancer screening  - PSA  3. Thyroid disorder screen  - TSH  4. Vitamin B12 deficiency  - Vitamin B12  5. Vitamin D deficiency  - Vitamin D, 25-hydroxy  6. Diabetes mellitus screening  - Hemoglobin A1c  7. Witnessed apneic spells  - Ambulatory referral to Pulmonology  8. Chronic right-sided low back pain without sciatica  - POCT URINALYSIS DIP (CLINITEK)  9. Pain in both lower extremities   10. Leg edema, right  - Ambulatory referral to Vascular Surgery  Follow up:  Follow up in 3 months

## 2022-04-27 ENCOUNTER — Other Ambulatory Visit: Payer: Self-pay | Admitting: Nurse Practitioner

## 2022-04-27 MED ORDER — VITAMIN D (ERGOCALCIFEROL) 1.25 MG (50000 UNIT) PO CAPS: 50000.0000 [IU] | ORAL_CAPSULE | ORAL | 0 refills | Status: DC

## 2022-04-27 MED ORDER — LEVOTHYROXINE SODIUM 75 MCG PO TABS: 75.0000 ug | ORAL_TABLET | Freq: Every day | ORAL | 11 refills | Status: DC

## 2022-05-11 ENCOUNTER — Encounter: Payer: Self-pay | Admitting: Adult Health

## 2022-05-11 ENCOUNTER — Ambulatory Visit (INDEPENDENT_AMBULATORY_CARE_PROVIDER_SITE_OTHER): Payer: No Typology Code available for payment source | Admitting: Adult Health

## 2022-05-11 VITALS — BP 108/80 | HR 87 | Ht 71.0 in | Wt 242.0 lb

## 2022-05-11 DIAGNOSIS — R0683 Snoring: Secondary | ICD-10-CM | POA: Diagnosis not present

## 2022-05-11 NOTE — Progress Notes (Signed)
Unsure if he had flu vaccine for 2023.  Has had 2 Pfizer vaccines for [employer].

## 2022-05-11 NOTE — Patient Instructions (Signed)
Set up for home sleep study  Work on healthy weight loss  Do not drive if sleepy  Follow up in 6 weeks to discuss to results and treatment plan

## 2022-05-11 NOTE — Progress Notes (Signed)
Reviewed and agree with assessment/plan.   Coralyn Helling, MD [facility] Pulmonary/Critical Care 05/11/2022, 12:51 PM Pager:  [phone]

## 2022-05-11 NOTE — Addendum Note (Signed)
Addended by: Delrae Rend on: 05/11/2022 01:22 PM   Modules accepted: Orders

## 2022-05-11 NOTE — Assessment & Plan Note (Signed)
Loud snoring, witnessed apneic events, daytime sleepiness all suspicious for underlying sleep apnea.  Set patient up for home sleep study.  - discussed how weight can impact sleep and risk for sleep disordered breathing - discussed options to assist with weight loss: combination of diet modification, cardiovascular and strength training exercises   - had an extensive discussion regarding the adverse health consequences related to untreated sleep disordered breathing - specifically discussed the risks for hypertension, coronary artery disease, cardiac dysrhythmias, cerebrovascular disease, and diabetes - lifestyle modification discussed   - discussed how sleep disruption can increase risk of accidents, particularly when driving - safe driving practices were discussed   Plan  Patient Instructions  Set up for home sleep study  Work on healthy weight loss  Do not drive if sleepy  Follow up in 6 weeks to discuss to results and treatment plan

## 2022-05-11 NOTE — Progress Notes (Signed)
  ID: Troy Alvarado, male    DOB: December 16, 1975, 47 y.o.   MRN: [id number]  Chief Complaint  Patient presents with   Consult    Referring provider: Ivonne Andrew, NP  HPI: 47 year old male seen for sleep consult May 11, 2022 for snoring, daytime sleepiness and witnessed apneic events  TEST/EVENTS :   05/11/2022 Sleep consult  Patient presents for a sleep consult today.  Kindly referred by primary care provider Angus Seller, NP .  Patient complains of loud snoring that is disruptive to his spouses sleep.  Witnessed apneic events and daytime sleepiness.  Patient does not take any sleep aids.  Occasionally naps on the weekend because he feels tired.  Typically goes to bed between 8 and 10 PM.  Goes to sleep very quickly.  Is only up a couple times at night.  Gets up at 5 AM.  Weight is up 20 pounds over the last 2 years.  Current weight is at 242 pounds with a BMI of 33.  Has never had a sleep study before.  Has no dental work.  No caffeine intake.  No symptoms suspicious for cataplexy or sleep paralysis.  Epworth score is 4 out of 24.  Typically gets sleepy if he sits down to watch TV and in the evening hours.  Social history patient is married.  Has 2 children.  Works in Data processing manager.  Drinks alcohol socially.  Does use marijuana.  No other drug use.  Family history positive for sleep apnea.    No Known Allergies  Immunization History  Administered Date(s) Administered   Influenza,inj,Quad PF,6+ Mos 10/24/2013, 12/06/2017   Tdap 09/24/2013    Past Medical History:  Diagnosis Date   Chronic low back pain    Family history of prostate cancer    History of hyperthyroidism 2009   prior oral radioablation therapy   Hypothyroidism    s/p ablation   Impaired fasting blood sugar    Obesity    Obesity     Tobacco History: Social History   Tobacco Use  Smoking Status Never  Smokeless Tobacco Never  Tobacco Comments   Currently smokes marijuanna.   Counseling given:  Not Answered Tobacco comments: Currently smokes marijuanna.   Outpatient Medications Prior to Visit  Medication Sig Dispense Refill   levothyroxine (SYNTHROID) 75 MCG tablet Take 1 tablet (75 mcg total) by mouth daily. 30 tablet 11   Multiple Vitamins-Minerals (MENS 50+ MULTIVITAMIN) TABS Take 1 tablet by mouth daily. 30 tablet 3   Vitamin D, Ergocalciferol, (DRISDOL) 1.25 MG (50000 UNIT) CAPS capsule Take 1 capsule (50,000 Units total) by mouth every 7 (seven) days. 5 capsule 0   naproxen sodium (ANAPROX DS) 550 MG tablet Take 1 tablet (550 mg total) by mouth 2 (two) times daily with a meal. For back pain. (Patient not taking: Reported on 05/11/2022) 20 tablet 0   predniSONE (DELTASONE) 20 MG tablet Take 2 tablets (40 mg total) by mouth daily. (Patient not taking: Reported on 04/26/2022) 10 tablet 0   tiZANidine (ZANAFLEX) 4 MG tablet Take 1 tablet (4 mg total) by mouth every 6 (six) hours as needed for muscle spasms. (Patient not taking: Reported on 05/11/2022) 30 tablet 0   No facility-administered medications prior to visit.     Review of Systems:   Constitutional:   No  weight loss, night sweats,  Fevers, chills, + fatigue, or  lassitude.  HEENT:   No headaches,  Difficulty swallowing,  Tooth/dental problems, or  Sore throat,  No sneezing, itching, ear ache, nasal congestion, post nasal drip,   CV:  No chest pain,  Orthopnea, PND, swelling in lower extremities, anasarca, dizziness, palpitations, syncope.   GI  No heartburn, indigestion, abdominal pain, nausea, vomiting, diarrhea, change in bowel habits, loss of appetite, bloody stools.   Resp: No shortness of breath with exertion or at rest.  No excess mucus, no productive cough,  No non-productive cough,  No coughing up of blood.  No change in color of mucus.  No wheezing.  No chest wall deformity  Skin: no rash or lesions.  GU: no dysuria, change in color of urine, no urgency or frequency.  No flank pain, no  hematuria   MS:  No joint pain or swelling.  No decreased range of motion.  No back pain.    Physical Exam  BP 108/80 (BP Location: Left Arm, Patient Position: Sitting, Cuff Size: Large)   Pulse 87   Ht  (1.803 m)   Wt 242 lb (109.8 kg)   SpO2 99%   BMI 33.75 kg/m   GEN: A/Ox3; pleasant , NAD, well nourished    HEENT:  [place]/AT,  NOSE-clear, THROAT-clear, no lesions, no postnasal drip or exudate noted.  Class III-IV MP airway  NECK:  Supple w/ fair ROM; no JVD; normal carotid impulses w/o bruits; no thyromegaly or nodules palpated; no lymphadenopathy.    RESP  Clear  P & A; w/o, wheezes/ rales/ or rhonchi. no accessory muscle use, no dullness to percussion  CARD:  RRR, no m/r/g, no peripheral edema, pulses intact, no cyanosis or clubbing.  GI:   Soft & nt; nml bowel sounds; no organomegaly or masses detected.   Musco: Warm bil, no deformities or joint swelling noted.   Neuro: alert, no focal deficits noted.    Skin: Warm, no lesions or rashes    Lab Results:  CBC    Component Value Date/Time   WBC 4.4 04/26/2022 1008   WBC 5.4 04/09/2021 1023   RBC 4.74 04/26/2022 1008   RBC 4.69 04/09/2021 1023   HGB 13.6 04/26/2022 1008   HCT 43.0 04/26/2022 1008   PLT 260 04/26/2022 1008   MCV 91 04/26/2022 1008   MCH 28.7 04/26/2022 1008   MCH 29.2 04/09/2021 1023   MCHC 31.6 04/26/2022 1008   MCHC 33.0 04/09/2021 1023   RDW 13.2 04/26/2022 1008   LYMPHSABS 1.8 04/09/2021 1023   MONOABS 0.4 04/09/2021 1023   EOSABS 0.2 04/09/2021 1023   BASOSABS 0.1 04/09/2021 1023    BMET    Component Value Date/Time   NA 141 04/26/2022 1008   K 4.1 04/26/2022 1008   CL 105 04/26/2022 1008   CO2 25 04/26/2022 1008   GLUCOSE 97 04/26/2022 1008   GLUCOSE 75 03/09/2022 1543   BUN 11 04/26/2022 1008   CREATININE 0.96 04/26/2022 1008   CREATININE 0.96 07/30/2015 0916   CALCIUM 9.2 04/26/2022 1008   GFRNONAA >60 03/09/2022 1543   GFRAA >60 11/22/2018 1146    BNP No  results found for: "BNP"  ProBNP No results found for: "PROBNP"  Imaging: No results found.        No data to display          No results found for: "NITRICOXIDE"      Assessment & Plan:   Snoring Loud snoring, witnessed apneic events, daytime sleepiness all suspicious for underlying sleep apnea.  Set patient up for home sleep study.  - discussed how weight can impact sleep  and risk for sleep disordered breathing - discussed options to assist with weight loss: combination of diet modification, cardiovascular and strength training exercises   - had an extensive discussion regarding the adverse health consequences related to untreated sleep disordered breathing - specifically discussed the risks for hypertension, coronary artery disease, cardiac dysrhythmias, cerebrovascular disease, and diabetes - lifestyle modification discussed   - discussed how sleep disruption can increase risk of accidents, particularly when driving - safe driving practices were discussed   Plan  Patient Instructions  Set up for home sleep study  Work on healthy weight loss  Do not drive if sleepy  Follow up in 6 weeks to discuss to results and treatment plan        Rubye Oaks, NP 05/11/2022

## 2022-05-19 ENCOUNTER — Telehealth: Payer: Self-pay

## 2022-05-19 LAB — CBC
Hematocrit: 43 % (ref 37.5–51.0)
Hemoglobin: 13.6 g/dL (ref 13.0–17.7)
MCH: 28.7 pg (ref 26.6–33.0)
MCHC: 31.6 g/dL (ref 31.5–35.7)
MCV: 91 fL (ref 79–97)
Platelets: 260 10*3/uL (ref 150–450)
RBC: 4.74 x10E6/uL (ref 4.14–5.80)
RDW: 13.2 % (ref 11.6–15.4)
WBC: 4.4 10*3/uL (ref 3.4–10.8)

## 2022-05-19 LAB — COMPREHENSIVE METABOLIC PANEL
ALT: 26 IU/L (ref 0–44)
AST: 22 IU/L (ref 0–40)
Albumin/Globulin Ratio: 1.8 (ref 1.2–2.2)
Albumin: 4.2 g/dL (ref 4.1–5.1)
Alkaline Phosphatase: 79 IU/L (ref 44–121)
BUN/Creatinine Ratio: 11 (ref 9–20)
BUN: 11 mg/dL (ref 6–24)
Bilirubin Total: 0.3 mg/dL (ref 0.0–1.2)
CO2: 25 mmol/L (ref 20–29)
Calcium: 9.2 mg/dL (ref 8.7–10.2)
Chloride: 105 mmol/L (ref 96–106)
Creatinine, Ser: 0.96 mg/dL (ref 0.76–1.27)
Globulin, Total: 2.4 g/dL (ref 1.5–4.5)
Glucose: 97 mg/dL (ref 70–99)
Potassium: 4.1 mmol/L (ref 3.5–5.2)
Sodium: 141 mmol/L (ref 134–144)
Total Protein: 6.6 g/dL (ref 6.0–8.5)
eGFR: 98 mL/min/{1.73_m2} (ref >59)

## 2022-05-19 LAB — HEMOGLOBIN A1C
Est. average glucose Bld gHb Est-mCnc: 123 mg/dL
Hgb A1c MFr Bld: 5.9 % — ABNORMAL HIGH (ref 4.8–5.6)

## 2022-05-19 LAB — VITAMIN D 25 HYDROXY (VIT D DEFICIENCY, FRACTURES): Vit D, 25-Hydroxy: 17 ng/mL — ABNORMAL LOW (ref 30.0–100.0)

## 2022-05-19 LAB — PSA

## 2022-05-19 LAB — TSH: TSH: 10.1 u[IU]/mL — ABNORMAL HIGH (ref 0.450–4.500)

## 2022-05-19 LAB — VITAMIN B12: Vitamin B-12: 404 pg/mL (ref 232–1245)

## 2022-05-25 NOTE — Telephone Encounter (Signed)
Appt has been r/s

## 2022-06-02 ENCOUNTER — Encounter: Payer: No Typology Code available for payment source | Admitting: Vascular Surgery

## 2022-06-02 ENCOUNTER — Encounter (HOSPITAL_COMMUNITY): Payer: No Typology Code available for payment source

## 2022-06-02 ENCOUNTER — Other Ambulatory Visit: Payer: Self-pay | Admitting: Nurse Practitioner

## 2022-06-02 NOTE — Telephone Encounter (Signed)
Please advise KH 

## 2022-06-07 ENCOUNTER — Other Ambulatory Visit: Payer: Self-pay | Admitting: *Deleted

## 2022-06-07 DIAGNOSIS — M7989 Other specified soft tissue disorders: Secondary | ICD-10-CM

## 2022-06-12 ENCOUNTER — Encounter (INDEPENDENT_AMBULATORY_CARE_PROVIDER_SITE_OTHER): Payer: No Typology Code available for payment source

## 2022-06-12 ENCOUNTER — Telehealth: Payer: Self-pay | Admitting: Pulmonary Disease

## 2022-06-12 DIAGNOSIS — G4733 Obstructive sleep apnea (adult) (pediatric): Secondary | ICD-10-CM

## 2022-06-12 DIAGNOSIS — R0683 Snoring: Secondary | ICD-10-CM

## 2022-06-12 NOTE — Telephone Encounter (Signed)
Call patient  Sleep study result  Date of study: 05/24/2022  Impression: Severe obstructive sleep apnea Moderate oxygen desaturations  Recommendation: DME referral  Recommend CPAP therapy for moderate obstructive sleep apnea  Auto titrating CPAP with pressure settings of 5-20 will be appropriate  Encourage weight loss measures  Follow-up in the office 4 to 6 weeks following initiation of treatment

## 2022-06-12 NOTE — Telephone Encounter (Signed)
He has appointment next week to discuss results , can set up sooner this week, can double book this week with virtual or in person if able

## 2022-06-15 ENCOUNTER — Ambulatory Visit (HOSPITAL_COMMUNITY)
Admission: RE | Admit: 2022-06-15 | Discharge: 2022-06-15 | Disposition: A | Discharge status: Home / Self Care | Payer: No Typology Code available for payment source | Source: Ambulatory Visit | Attending: Vascular Surgery | Admitting: Vascular Surgery

## 2022-06-15 ENCOUNTER — Ambulatory Visit (INDEPENDENT_AMBULATORY_CARE_PROVIDER_SITE_OTHER): Payer: No Typology Code available for payment source | Admitting: Physician Assistant

## 2022-06-15 VITALS — BP 175/114 | HR 74 | Temp 97.4°F | Resp 18 | Ht 71.0 in | Wt 236.5 lb

## 2022-06-15 DIAGNOSIS — M7989 Other specified soft tissue disorders: Secondary | ICD-10-CM | POA: Diagnosis present

## 2022-06-15 DIAGNOSIS — I872 Venous insufficiency (chronic) (peripheral): Secondary | ICD-10-CM | POA: Diagnosis not present

## 2022-06-15 NOTE — Progress Notes (Addendum)
Requested by:  Ivonne Andrew, NP [address] [place],  [place] [address]  Reason for consultation: BLE swelling    History of Present Illness   Troy Alvarado is a 47 y.o. (1975-08-04) male who presents for evaluation of right lower extremity swelling, tingling, numbness, cramping and heaviness. He has some cramping and numbness in left leg as well but not as much as right. This has been going on for a couple years now. He says it has not really progressed. The cramping happens both in his legs and feet/toes. He says the swelling in his right leg is present all the time.He has increased his walking, which he says helps.  However he explains that sometimes when he is walking he gets pain across his ankle and his leg sort of gives out and he will stumble. He says he used to play basketball a lot but he will have frequent falls stumbling over his leg. He also wears some compression leggings while walking that help his legs feel better, but they do not help with his right leg giving out or his swelling. He does not regularly elevate his legs. 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 He has been doing this for 12+ years. He does not have any history of DVT. No family history of venous disease.   Venous symptoms include:  heavy, tired, swelling,numbness, tingling Onset/duration:  > 2 years  Occupation:  Data processing manager for [employer]. Aggravating factors: prolonged standing Alleviating factors: walking, compression  Compression:  leggings Helps:  yes Pain medications:  none Previous vein procedures:  none History of DVT:  no  Past Medical History:  Diagnosis Date   Chronic low back pain    Family history of prostate cancer    History of hyperthyroidism 2009   prior oral radioablation therapy   Hypothyroidism    s/p ablation   Impaired fasting blood sugar    Obesity    Obesity     Past Surgical History:  Procedure Laterality Date   NO  PAST SURGERIES  07/2015    Social History   Socioeconomic History   Marital status: Married    Spouse name: Not on file   Number of children: Not on file   Years of education: Not on file   Highest education level: Not on file  Occupational History   Occupation: warehouse work/distribution    Employer: [employer]  Tobacco Use   Smoking status: Never   Smokeless tobacco: Never   Tobacco comments:    Currently smokes marijuanna.  Substance and Sexual Activity   Alcohol use: Yes    Alcohol/week: 8.0 standard drinks of alcohol    Types: 5 Cans of beer, 3 Shots of liquor per week   Drug use: Yes    Types: Marijuana   Sexual activity: Not on file  Other Topics Concern   Not on file  Social History Narrative   Married, 2 children, 2 girls.    Walks for exercise on the job.  Works in Photographer, Data processing manager.   As of 07/2015.    Social Determinants of Health   Financial Resource Strain: Not on file  Food Insecurity: Not on file  Transportation Needs: Not on file  Physical Activity: Not on file  Stress: Not on file  Social Connections: Not on file  Intimate Partner Violence: Not on file    Family History  Problem Relation Age of Onset  Cirrhosis Mother        died of cirrhosis   Alcohol abuse Mother    Crohn's disease Mother    Stroke Mother    Hypertension Father    Cancer Father        74   Diabetes Brother    Heart disease Neg Hx     Current Outpatient Medications  Medication Sig Dispense Refill   levothyroxine (SYNTHROID) 75 MCG tablet Take 1 tablet (75 mcg total) by mouth daily. 30 tablet 11   Multiple Vitamins-Minerals (MENS 50+ MULTIVITAMIN) TABS Take 1 tablet by mouth daily. 30 tablet 3   naproxen sodium (ANAPROX DS) 550 MG tablet Take 1 tablet (550 mg total) by mouth 2 (two) times daily with a meal. For back pain. (Patient not taking: Reported on 05/11/2022) 20 tablet 0   predniSONE (DELTASONE) 20 MG tablet Take 2 tablets (40 mg total) by mouth daily.  (Patient not taking: Reported on 04/26/2022) 10 tablet 0   tiZANidine (ZANAFLEX) 4 MG tablet Take 1 tablet (4 mg total) by mouth every 6 (six) hours as needed for muscle spasms. (Patient not taking: Reported on 05/11/2022) 30 tablet 0   Vitamin D, Ergocalciferol, (DRISDOL) 1.25 MG (50000 UNIT) CAPS capsule TAKE 1 CAPSULE (50,000 UNITS TOTAL) BY MOUTH EVERY 7 (SEVEN) DAYS 5 capsule 0   No current facility-administered medications for this visit.    No Known Allergies  REVIEW OF SYSTEMS (negative unless checked):   Cardiac:  []  Chest pain or chest pressure? []  Shortness of breath upon activity? []  Shortness of breath when lying flat? []  Irregular heart rhythm?  Vascular:  []  Pain in calf, thigh, or hip brought on by walking? []  Pain in feet at night that wakes you up from your sleep? []  Blood clot in your veins? [x]  Leg swelling?  Pulmonary:  []  Oxygen at home? []  Productive cough? []  Wheezing?  Neurologic:  []  Sudden weakness in arms or legs? []  Sudden numbness in arms or legs? []  Sudden onset of difficult speaking or slurred speech? []  Temporary loss of vision in one eye? []  Problems with dizziness?  Gastrointestinal:  []  Blood in stool? []  Vomited blood?  Genitourinary:  []  Burning when urinating? []  Blood in urine?  Psychiatric:  []  Major depression  Hematologic:  []  Bleeding problems? []  Problems with blood clotting?  Dermatologic:  []  Rashes or ulcers?  Constitutional:  []  Fever or chills?  Ear/Nose/Throat:  []  Change in hearing? []  Nose bleeds? []  Sore throat?  Musculoskeletal:  []  Back pain? []  Joint pain? []  Muscle pain?   Physical Examination     Vitals:   06/15/22 0819  BP: (!) 175/114  Pulse: 74  Resp: 18  Temp: (!) 97.4 F (36.3 C)  TempSrc: Temporal  SpO2: 95%  Weight: 236 lb 8 oz (107.3 kg)  Height: 5\' 11"  (1.803 m)   Body mass index is 32.99 kg/m.  General:  WDWN in NAD; vital signs documented above Gait: Normal HENT:  WNL, normocephalic Pulmonary: normal non-labored breathing , without wheezing Cardiac: regular HR Abdomen: soft Vascular Exam/Pulses:2+ femoral, popliteal, DP and PT pulses bilaterally. Feet warm and well perfused Extremities: without varicose veins, without reticular veins, without edema, without stasis pigmentation, without lipodermatosclerosis, without ulcers Musculoskeletal: no muscle wasting or atrophy  Neurologic: A&O X 3;  No focal weakness or paresthesias are detected Psychiatric:  The pt has Normal affect.  Non-invasive Vascular Imaging   BLE Venous Insufficiency Duplex (06/15/22):  RLE:  No DVT and SVT,  GSV reflux at [facility] and mid thigh GSV diameter 0.28-0.54 cm No SSV reflux  CFV deep venous reflux   Medical Decision Making   Troy Alvarado is a 47 y.o. male who presents with: RLE chronic venous insufficiency with pain and swelling. Duplex today shows no DVT or SVT. He does have reflux in his CFV as well as GSV. No SSV reflux. The greater saphenous vein is borderline adequate size to be considered a candidate for ablation. I think with his symptoms he can be considered.  - Based on the patient's history and examination, I recommend:elevation of his legs above the level of his heart, thigh high compression stockings, exercise, refraining from prolonged sitting or standing I discussed with the patient the use of her 20-30 mm thigh high compression stockings and need for 3 month trial of such.  He was measured and purchased a pair at today's visit The patient will follow up in 3 months with one of the MD's for further evaluation for RLE ablation   Troy Congress, PA-C [facility] Office: [phone]  06/15/2022, 8:52 AM  Clinic MD: [place]/ Myra Gianotti

## 2022-06-22 NOTE — Telephone Encounter (Signed)
Patient has an appointment on 06/23/22, will discuss at appointment.  Nothing further needed.

## 2022-06-23 ENCOUNTER — Encounter: Payer: Self-pay | Admitting: Adult Health

## 2022-06-23 ENCOUNTER — Ambulatory Visit (INDEPENDENT_AMBULATORY_CARE_PROVIDER_SITE_OTHER): Payer: No Typology Code available for payment source | Admitting: Adult Health

## 2022-06-23 VITALS — BP 118/90 | HR 75 | Ht 71.0 in | Wt 240.2 lb

## 2022-06-23 DIAGNOSIS — G4733 Obstructive sleep apnea (adult) (pediatric): Secondary | ICD-10-CM | POA: Diagnosis not present

## 2022-06-23 NOTE — Progress Notes (Signed)
@Patient  ID: Troy Alvarado, male    DOB: [birth year], 47 y.o.   MRN: [id number]  Chief Complaint  Patient presents with   Follow-up    Referring provider: Ivonne Andrew, NP  HPI: 47 year old male seen for sleep consult May 11, 2022 for snoring and daytime sleepiness found to have severe sleep apnea  TEST/EVENTS :   06/23/2022 Follow up ; OSA  Patient presents for a 1 month follow-up.  Patient seen last visit for a sleep consult for daytime sleepiness, snoring and witnessed apneic events.  He was set up for home sleep study on May 24, 2022 that showed severe sleep apnea with AHI at 60/hour and SpO2 low at 81%.  We discussed his sleep study results in detail.  Went over treatment options.  With severity of sleep apnea patient will proceed with CPAP therapy. Patient education was given.     No Known Allergies  Immunization History  Administered Date(s) Administered   Influenza,inj,Quad PF,6+ Mos 10/24/2013, 12/06/2017   Tdap 09/24/2013    Past Medical History:  Diagnosis Date   Chronic low back pain    Family history of prostate cancer    History of hyperthyroidism 2009   prior oral radioablation therapy   Hypothyroidism    s/p ablation   Impaired fasting blood sugar    Obesity    Obesity     Tobacco History: Social History   Tobacco Use  Smoking Status Never  Smokeless Tobacco Never  Tobacco Comments   Currently smokes marijuanna.   Counseling given: Not Answered Tobacco comments: Currently smokes marijuanna.   Outpatient Medications Prior to Visit  Medication Sig Dispense Refill   levothyroxine (SYNTHROID) 75 MCG tablet Take 1 tablet (75 mcg total) by mouth daily. 30 tablet 11   Multiple Vitamins-Minerals (MENS 50+ MULTIVITAMIN) TABS Take 1 tablet by mouth daily. 30 tablet 3   Vitamin D, Ergocalciferol, (DRISDOL) 1.25 MG (50000 UNIT) CAPS capsule TAKE 1 CAPSULE (50,000 UNITS TOTAL) BY MOUTH EVERY 7 (SEVEN) DAYS 5 capsule 0   naproxen sodium (ANAPROX DS)  550 MG tablet Take 1 tablet (550 mg total) by mouth 2 (two) times daily with a meal. For back pain. (Patient not taking: Reported on 05/11/2022) 20 tablet 0   predniSONE (DELTASONE) 20 MG tablet Take 2 tablets (40 mg total) by mouth daily. (Patient not taking: Reported on 04/26/2022) 10 tablet 0   tiZANidine (ZANAFLEX) 4 MG tablet Take 1 tablet (4 mg total) by mouth every 6 (six) hours as needed for muscle spasms. (Patient not taking: Reported on 05/11/2022) 30 tablet 0   No facility-administered medications prior to visit.     Review of Systems:   Constitutional:   No  weight loss, night sweats,  Fevers, chills, + fatigue, or  lassitude.  HEENT:   No headaches,  Difficulty swallowing,  Tooth/dental problems, or  Sore throat,                No sneezing, itching, ear ache, nasal congestion, post nasal drip,   CV:  No chest pain,  Orthopnea, PND, swelling in lower extremities, anasarca, dizziness, palpitations, syncope.   GI  No heartburn, indigestion, abdominal pain, nausea, vomiting, diarrhea, change in bowel habits, loss of appetite, bloody stools.   Resp: No shortness of breath with exertion or at rest.  No excess mucus, no productive cough,  No non-productive cough,  No coughing up of blood.  No change in color of mucus.  No wheezing.  No chest wall deformity  Skin: no rash or lesions.  GU: no dysuria, change in color of urine, no urgency or frequency.  No flank pain, no hematuria   MS:  No joint pain or swelling.  No decreased range of motion.  No back pain.    Physical Exam  BP (!) 118/90 (BP Location: Left Arm, Patient Position: Sitting, Cuff Size: Large)   Pulse 75   Ht 5\' 11"  (1.803 m)   Wt 240 lb 3.2 oz (109 kg)   SpO2 97%   BMI 33.50 kg/m   GEN: A/Ox3; pleasant , NAD, well nourished    HEENT:  [place]/AT,  NOSE-clear, THROAT-clear, no lesions, no postnasal drip or exudate noted. Class 3-4 MP airway   NECK:  Supple w/ fair ROM; no JVD; normal carotid impulses w/o bruits;  no thyromegaly or nodules palpated; no lymphadenopathy.    RESP  Clear  P & A; w/o, wheezes/ rales/ or rhonchi. no accessory muscle use, no dullness to percussion  CARD:  RRR, no m/r/g, no peripheral edema, pulses intact, no cyanosis or clubbing.  GI:   Soft & nt; nml bowel sounds; no organomegaly or masses detected.   Musco: Warm bil, no deformities or joint swelling noted.   Neuro: alert, no focal deficits noted.    Skin: Warm, no lesions or rashes    Lab Results:  CBC   BMET   BNP No results found for: "BNP"  ProBNP No results found for: "PROBNP"  Imaging: VAS [place] LOWER EXTREMITY VENOUS REFLUX  Result Date: 06/15/2022  Lower Venous Reflux Study Patient Name:  Troy Alvarado  Date of Exam:   06/15/2022 Medical Rec #: [id number]      Accession #:    [id number] Date of Birth: 29-Jan-1975      Patient Gender: M Patient Age:   52 years Exam Location:  [facility] Procedure:      VAS [place] LOWER EXTREMITY VENOUS REFLUX Referring Phys: EMMA COLLINS --------------------------------------------------------------------------------  Indications: Edema, and cramping.  Comparison Study: No prior exam Performing Technologist: Elita Quick RVT  Examination Guidelines: A complete evaluation includes B-mode imaging, spectral Doppler, color Doppler, and power Doppler as needed of all accessible portions of each vessel. Bilateral testing is considered an integral part of a complete examination. Limited examinations for reoccurring indications may be performed as noted. The reflux portion of the exam is performed with the patient in reverse Trendelenburg. Significant venous reflux is defined as >500 ms in the superficial venous system, and >1 second in the deep venous system.  Venous Reflux Times +--------------+---------+------+-----------+------------+---------+ RIGHT         Reflux NoRefluxReflux TimeDiameter cmsComments                          Yes                                    +--------------+---------+------+-----------+------------+---------+ CFV                     yes   >1 second                       +--------------+---------+------+-----------+------------+---------+ FV mid        no                                              +--------------+---------+------+-----------+------------+---------+  Popliteal     no                                              +--------------+---------+------+-----------+------------+---------+ GSV at [facility]              yes    >500 ms      0.79              +--------------+---------+------+-----------+------------+---------+ GSV prox thighno                            0.54              +--------------+---------+------+-----------+------------+---------+ GSV mid thigh           yes    >500 ms      0.36              +--------------+---------+------+-----------+------------+---------+ GSV dist thighno                            0.37              +--------------+---------+------+-----------+------------+---------+ GSV at knee   no                            0.44              +--------------+---------+------+-----------+------------+---------+ GSV prox calf no                            0.36              +--------------+---------+------+-----------+------------+---------+ GSV mid calf  no                            0.28              +--------------+---------+------+-----------+------------+---------+ SSV Pop Fossa no                            0.27              +--------------+---------+------+-----------+------------+---------+ SSV prox calf no                            0.22              +--------------+---------+------+-----------+------------+---------+ SSV mid calf  no                            0.27              +--------------+---------+------+-----------+------------+---------+ AASV 0        no                            0.26               +--------------+---------+------+-----------+------------+---------+ AASV P        no                            0.14    too small +--------------+---------+------+-----------+------------+---------+  Summary: Right: - No evidence of deep vein thrombosis seen in the right lower extremity, from the common femoral through the popliteal veins. - No evidence of superficial venous thrombosis in the right lower extremity. - No evidence of superficial venous reflux seen in the right short saphenous vein. - Venous reflux is noted in the right common femoral vein. - Venous reflux is noted in the right sapheno-femoral junction. - Venous reflux is noted in the right greater saphenous vein in the thigh.  *See table(s) above for measurements and observations. Electronically signed by Waverly Ferrari MD on 06/15/2022 at 8:15:32 AM.    Final           No data to display          No results found for: "NITRICOXIDE"      Assessment & Plan:   No problem-specific Assessment & Plan notes found for this encounter.     Rubye Oaks, NP 06/23/2022

## 2022-06-23 NOTE — Progress Notes (Signed)
Reviewed and agree with assessment/plan.   Coralyn Helling, MD [facility] Pulmonary/Critical Care 06/23/2022, 11:38 AM Pager:  [phone]

## 2022-06-23 NOTE — Patient Instructions (Signed)
Begin CPAP at bedtime, goal was to wear all night long for at least 6 or more hours Work on healthy weight loss  Do not drive if sleepy  Follow up in 3 months and As needed

## 2022-06-23 NOTE — Assessment & Plan Note (Signed)
Severe obstructive sleep apnea-patient given on sleep apnea.  Went over treatment options.  Patient will proceed with CPAP therapy will begin auto CPAP 5 to 20 cm H2O.  Mask of choice.  CPAP care discussed  - discussed how weight can impact sleep and risk for sleep disordered breathing - discussed options to assist with weight loss: combination of diet modification, cardiovascular and strength training exercises   - had an extensive discussion regarding the adverse health consequences related to untreated sleep disordered breathing - specifically discussed the risks for hypertension, coronary artery disease, cardiac dysrhythmias, cerebrovascular disease, and diabetes - lifestyle modification discussed   - discussed how sleep disruption can increase risk of accidents, particularly when driving - safe driving practices were discussed   Plan  Patient Instructions  Begin CPAP at bedtime, goal was to wear all night long for at least 6 or more hours Work on healthy weight loss  Do not drive if sleepy  Follow up in 3 months and As needed

## 2022-06-23 NOTE — Addendum Note (Signed)
Addended by: Delrae Rend on: 06/23/2022 12:01 PM   Modules accepted: Orders

## 2022-07-10 ENCOUNTER — Ambulatory Visit (HOSPITAL_COMMUNITY)
Admission: EM | Admit: 2022-07-10 | Discharge: 2022-07-10 | Disposition: A | Discharge status: Home / Self Care | Payer: No Typology Code available for payment source | Attending: Emergency Medicine | Admitting: Emergency Medicine

## 2022-07-10 ENCOUNTER — Other Ambulatory Visit: Payer: Self-pay

## 2022-07-10 ENCOUNTER — Encounter (HOSPITAL_COMMUNITY): Payer: Self-pay | Admitting: *Deleted

## 2022-07-10 DIAGNOSIS — M109 Gout, unspecified: Secondary | ICD-10-CM

## 2022-07-10 DIAGNOSIS — R030 Elevated blood-pressure reading, without diagnosis of hypertension: Secondary | ICD-10-CM

## 2022-07-10 MED ORDER — PREDNISONE 20 MG PO TABS: 40.0000 mg | ORAL_TABLET | Freq: Every day | ORAL | 0 refills | Status: AC

## 2022-07-10 NOTE — Discharge Instructions (Addendum)
Follow up with your regular primary care provider about your gout and your high blood pressure reading today at urgent care.   You should not need to take ibuprofen while taking the prednisone for gout. After you finish the prednisone, you should feel better, but if you still have a little pain, you can switch back to the ibuprofen per package directions.

## 2022-07-10 NOTE — ED Triage Notes (Signed)
Pt reports on Friday he noticed swelling to RT leg and foot. Pt also has pain to RT leg. No known cause for swelling and pain.

## 2022-07-10 NOTE — ED Provider Notes (Signed)
[facility]    CSN: [id number] Arrival date & time: 07/10/22  4098      History   Chief Complaint Chief Complaint  Patient presents with   Leg Pain   Leg Swelling   Foot Swelling    HPI Troy Alvarado is a 47 y.o. male.  Patient reports swelling and pain in his right foot since 07/07/22.  Has been taking ibuprofen for it which helps some.  Pain is D top of his foot in the 2nd -4th MTP areas.  Denies swellign in his right calf. Denies injury. Denies hx of gout. Does drink beer.    Leg Pain   Past Medical History:  Diagnosis Date   Chronic low back pain    Family history of prostate cancer    History of hyperthyroidism 2009   prior oral radioablation therapy   Hypothyroidism    s/p ablation   Impaired fasting blood sugar    Obesity    Obesity     Patient Active Problem List   Diagnosis Date Noted   OSA (obstructive sleep apnea) 06/23/2022   Snoring 05/11/2022   Routine adult health maintenance 04/26/2022   Elevated blood-pressure reading without diagnosis of hypertension 09/10/2015   Decreased pulses in feet 09/10/2015   Skin lesion 09/10/2015   Proteinuria 09/10/2015   Encounter for health maintenance examination in adult 07/30/2015   Screening for prostate cancer 07/30/2015   Family history of prostate cancer in father 07/30/2015   Other specified hypothyroidism 07/30/2015   Impaired fasting blood sugar 07/30/2015   Obesity 07/30/2015   Chronic low back pain 07/30/2015   PARONYCHIA 12/01/2005    Past Surgical History:  Procedure Laterality Date   NO PAST SURGERIES  07/2015       Home Medications    Prior to Admission medications   Medication Sig Start Date End Date Taking? Authorizing Provider  levothyroxine (SYNTHROID) 75 MCG tablet Take 1 tablet (75 mcg total) by mouth daily. 04/27/22 04/27/23 Yes Ivonne Andrew, NP  Multiple Vitamins-Minerals (MENS 50+ MULTIVITAMIN) TABS Take 1 tablet by mouth daily. 04/20/22  Yes Ellsworth Lennox, PA-C   predniSONE (DELTASONE) 20 MG tablet Take 2 tablets (40 mg total) by mouth daily for 5 days. 07/10/22 07/15/22 Yes Cathlyn Parsons, NP  Vitamin D, Ergocalciferol, (DRISDOL) 1.25 MG (50000 UNIT) CAPS capsule TAKE 1 CAPSULE (50,000 UNITS TOTAL) BY MOUTH EVERY 7 (SEVEN) DAYS 06/05/22  Yes Ivonne Andrew, NP    Family History Family History  Problem Relation Age of Onset   Cirrhosis Mother        died of cirrhosis   Alcohol abuse Mother    Crohn's disease Mother    Stroke Mother    Hypertension Father    Cancer Father        84   Diabetes Brother    Heart disease Neg Hx     Social History Social History   Tobacco Use   Smoking status: Never   Smokeless tobacco: Never   Tobacco comments:    Currently smokes marijuanna.  Substance Use Topics   Alcohol use: Yes    Alcohol/week: 8.0 standard drinks of alcohol    Types: 5 Cans of beer, 3 Shots of liquor per week   Drug use: Yes    Types: Marijuana     Allergies   Patient has no known allergies.   Review of Systems Review of Systems   Physical Exam Triage Vital Signs ED Triage Vitals  Enc Vitals Group  BP 07/10/22 1009 (!) 146/94     Pulse Rate 07/10/22 1009 67     Resp 07/10/22 1009 18     Temp 07/10/22 1009 98.3 F (36.8 C)     Temp src --      SpO2 07/10/22 1009 97 %     Weight --      Height --      Head Circumference --      Peak Flow --      Pain Score 07/10/22 1008 8     Pain Loc --      Pain Edu? --      Excl. in GC? --    No data found.  Updated Vital Signs BP (!) 146/94   Pulse 67   Temp 98.3 F (36.8 C)   Resp 18   SpO2 97%   Visual Acuity Right Eye Distance:   Left Eye Distance:   Bilateral Distance:    Right Eye Near:   Left Eye Near:    Bilateral Near:     Physical Exam Constitutional:      Appearance: Normal appearance.  Cardiovascular:     Pulses:          Dorsalis pedis pulses are 2+ on the right side.       Posterior tibial pulses are 2+ on the right side.   Pulmonary:     Effort: Pulmonary effort is normal.  Musculoskeletal:     Right lower leg: Normal.     Right foot: Swelling and tenderness present.       Feet:  Feet:     Right foot:     Skin integrity: Warmth present. No erythema.  Neurological:     Mental Status: He is alert.      UC Treatments / Results  Labs (all labs ordered are listed, but only abnormal results are displayed) Labs Reviewed - No data to display  EKG   Radiology No results found.  Procedures Procedures (including critical care time)  Medications Ordered in UC Medications - No data to display  Initial Impression / Assessment and Plan / UC Course  I have reviewed the triage vital signs and the nursing notes.  Pertinent labs & imaging results that were available during my care of the patient were reviewed by me and considered in my medical decision making (see chart for details).    Sx consistent with gout. Given info on gout including avoiding high purine foods. BP is elevated - rec f/u with pcp.   Final Clinical Impressions(s) / UC Diagnoses   Final diagnoses:  Acute gout of right foot, unspecified cause  Elevated blood pressure reading     Discharge Instructions      Follow up with your regular primary care provider about your gout and your high blood pressure reading today at urgent care.   You should not need to take ibuprofen while taking the prednisone for gout. After you finish the prednisone, you should feel better, but if you still have a little pain, you can switch back to the ibuprofen per package directions.    ED Prescriptions     Medication Sig Dispense Auth. Provider   predniSONE (DELTASONE) 20 MG tablet Take 2 tablets (40 mg total) by mouth daily for 5 days. 10 tablet Cathlyn Parsons, NP      PDMP not reviewed this encounter.   Cathlyn Parsons, NP 07/10/22 1040

## 2022-07-12 ENCOUNTER — Encounter: Payer: Self-pay | Admitting: Nurse Practitioner

## 2022-07-12 ENCOUNTER — Ambulatory Visit (INDEPENDENT_AMBULATORY_CARE_PROVIDER_SITE_OTHER): Payer: No Typology Code available for payment source | Admitting: Nurse Practitioner

## 2022-07-12 VITALS — BP 124/75 | HR 96 | Temp 97.3°F | Wt 240.2 lb

## 2022-07-12 DIAGNOSIS — Z125 Encounter for screening for malignant neoplasm of prostate: Secondary | ICD-10-CM | POA: Diagnosis not present

## 2022-07-12 DIAGNOSIS — M109 Gout, unspecified: Secondary | ICD-10-CM | POA: Diagnosis not present

## 2022-07-12 NOTE — Progress Notes (Signed)
@Patient  ID: Troy Alvarado, male    DOB: 1975/03/31, 47 y.o.   MRN: [id number]  Chief Complaint  Patient presents with   Follow-up    On gout  and psa screening   Hypertension    Referring provider: Ivonne Andrew, NP   HPI  Patient presents today for an ED follow-up.  He was seen in the ED on 07/10/2022.  He was diagnosed with gout and elevated blood pressure.  He was prescribed prednisone.  He states that his right foot is still hurting and swollen but has improved since starting the prednisone 2 days ago.  He has not finished the entire course yet.  We will write him out of work for the rest of the week to let him rest his foot and complete his course of prednisone.  We will check uric acid level today.  Patient is also wanting his PSA level checked today.  Blood pressure is normal in office today.  Denies f/c/s, n/v/d, hemoptysis, PND, leg swelling Denies chest pain or edema        No Known Allergies  Immunization History  Administered Date(s) Administered   Influenza,inj,Quad PF,6+ Mos 10/24/2013, 12/06/2017   Tdap 09/24/2013    Past Medical History:  Diagnosis Date   Chronic low back pain    Family history of prostate cancer    History of hyperthyroidism 2009   prior oral radioablation therapy   Hypothyroidism    s/p ablation   Impaired fasting blood sugar    Obesity    Obesity     Tobacco History: Social History   Tobacco Use  Smoking Status Never  Smokeless Tobacco Never  Tobacco Comments   Currently smokes marijuanna.   Counseling given: Not Answered Tobacco comments: Currently smokes marijuanna.   Outpatient Encounter Medications as of 07/12/2022  Medication Sig   levothyroxine (SYNTHROID) 75 MCG tablet Take 1 tablet (75 mcg total) by mouth daily.   Multiple Vitamins-Minerals (MENS 50+ MULTIVITAMIN) TABS Take 1 tablet by mouth daily.   predniSONE (DELTASONE) 20 MG tablet Take 2 tablets (40 mg total) by mouth daily for 5 days.   Vitamin D,  Ergocalciferol, (DRISDOL) 1.25 MG (50000 UNIT) CAPS capsule TAKE 1 CAPSULE (50,000 UNITS TOTAL) BY MOUTH EVERY 7 (SEVEN) DAYS   No facility-administered encounter medications on file as of 07/12/2022.     Review of Systems  Review of Systems     Physical Exam  BP 124/75   Pulse 96   Temp (!) 97.3 F (36.3 C)   Wt 240 lb 3.2 oz (109 kg)   SpO2 98%   BMI 33.50 kg/m   Wt Readings from Last 5 Encounters:  07/12/22 240 lb 3.2 oz (109 kg)  06/23/22 240 lb 3.2 oz (109 kg)  06/15/22 236 lb 8 oz (107.3 kg)  05/11/22 242 lb (109.8 kg)  04/26/22 247 lb 9.6 oz (112.3 kg)     Physical Exam   Lab Results:  CBC    Component Value Date/Time   WBC 4.4 04/26/2022 1008   WBC 5.4 04/09/2021 1023   RBC 4.74 04/26/2022 1008   RBC 4.69 04/09/2021 1023   HGB 13.6 04/26/2022 1008   HCT 43.0 04/26/2022 1008   PLT 260 04/26/2022 1008   MCV 91 04/26/2022 1008   MCH 28.7 04/26/2022 1008   MCH 29.2 04/09/2021 1023   MCHC 31.6 04/26/2022 1008   MCHC 33.0 04/09/2021 1023   RDW 13.2 04/26/2022 1008   LYMPHSABS 1.8 04/09/2021 1023  MONOABS 0.4 04/09/2021 1023   EOSABS 0.2 04/09/2021 1023   BASOSABS 0.1 04/09/2021 1023    BMET    Component Value Date/Time   NA 141 04/26/2022 1008   K 4.1 04/26/2022 1008   CL 105 04/26/2022 1008   CO2 25 04/26/2022 1008   GLUCOSE 97 04/26/2022 1008   GLUCOSE 75 03/09/2022 1543   BUN 11 04/26/2022 1008   CREATININE 0.96 04/26/2022 1008   CREATININE 0.96 07/30/2015 0916   CALCIUM 9.2 04/26/2022 1008   GFRNONAA >60 03/09/2022 1543   GFRAA >60 11/22/2018 1146    BNP No results found for: "BNP"  ProBNP No results found for: "PROBNP"  Imaging: VAS [place] LOWER EXTREMITY VENOUS REFLUX  Result Date: 06/15/2022  Lower Venous Reflux Study Patient Name:  Troy Alvarado  Date of Exam:   06/15/2022 Medical Rec #: [id number]      Accession #:    [id number] Date of Birth: [birth year]      Patient Gender: M Patient Age:   15 years Exam Location:  [facility] Procedure:      VAS [place] LOWER EXTREMITY VENOUS REFLUX Referring Phys: EMMA COLLINS --------------------------------------------------------------------------------  Indications: Edema, and cramping.  Comparison Study: No prior exam Performing Technologist: Elita Quick RVT  Examination Guidelines: A complete evaluation includes B-mode imaging, spectral Doppler, color Doppler, and power Doppler as needed of all accessible portions of each vessel. Bilateral testing is considered an integral part of a complete examination. Limited examinations for reoccurring indications may be performed as noted. The reflux portion of the exam is performed with the patient in reverse Trendelenburg. Significant venous reflux is defined as >500 ms in the superficial venous system, and >1 second in the deep venous system.  Venous Reflux Times +--------------+---------+------+-----------+------------+---------+ RIGHT         Reflux NoRefluxReflux TimeDiameter cmsComments                          Yes                                   +--------------+---------+------+-----------+------------+---------+ CFV                     yes   >1 second                       +--------------+---------+------+-----------+------------+---------+ FV mid        no                                              +--------------+---------+------+-----------+------------+---------+ Popliteal     no                                              +--------------+---------+------+-----------+------------+---------+ GSV at SFJ              yes    >500 ms      0.79              +--------------+---------+------+-----------+------------+---------+ GSV prox thighno  0.54              +--------------+---------+------+-----------+------------+---------+ GSV mid thigh           yes    >500 ms      0.36               +--------------+---------+------+-----------+------------+---------+ GSV dist thighno                            0.37              +--------------+---------+------+-----------+------------+---------+ GSV at knee   no                            0.44              +--------------+---------+------+-----------+------------+---------+ GSV prox calf no                            0.36              +--------------+---------+------+-----------+------------+---------+ GSV mid calf  no                            0.28              +--------------+---------+------+-----------+------------+---------+ SSV Pop Fossa no                            0.27              +--------------+---------+------+-----------+------------+---------+ SSV prox calf no                            0.22              +--------------+---------+------+-----------+------------+---------+ SSV mid calf  no                            0.27              +--------------+---------+------+-----------+------------+---------+ AASV 0        no                            0.26              +--------------+---------+------+-----------+------------+---------+ AASV P        no                            0.14    too small +--------------+---------+------+-----------+------------+---------+   Summary: Right: - No evidence of deep vein thrombosis seen in the right lower extremity, from the common femoral through the popliteal veins. - No evidence of superficial venous thrombosis in the right lower extremity. - No evidence of superficial venous reflux seen in the right short saphenous vein. - Venous reflux is noted in the right common femoral vein. - Venous reflux is noted in the right sapheno-femoral junction. - Venous reflux is noted in the right greater saphenous vein in the thigh.  *See table(s) above for measurements and observations. Electronically signed by Waverly Ferrari MD on 06/15/2022 at 8:15:32 AM.     Final      Assessment & Plan:   Prostate cancer screening -Will  check PSA   2. Acute gout of right foot, unspecified cause  -uric acid level   Follow up:  Follow up in 3 months     Troy Andrew, NP 07/12/2022

## 2022-07-12 NOTE — Assessment & Plan Note (Signed)
-  Will check PSA   2. Acute gout of right foot, unspecified cause  -uric acid level   Follow up:  Follow up in 3 months

## 2022-07-12 NOTE — Patient Instructions (Addendum)
1. Prostate cancer screening  -Will check PSA   2. Acute gout of right foot, unspecified cause  -uric acid level   Follow up:  Follow up in 3 months   Gout  Gout is painful swelling of your joints. Gout is a type of arthritis. It is caused by having too much uric acid in your body. Uric acid is a chemical that is made when your body breaks down substances called purines. If your body has too much uric acid, sharp crystals can form and build up in your joints. This causes pain and swelling. Gout attacks can happen quickly and be very painful (acute gout). Over time, the attacks can affect more joints and happen more often (chronic gout). What are the causes? Gout is caused by too much uric acid in your blood. This can happen because: Your kidneys do not remove enough uric acid from your blood. Your body makes too much uric acid. You eat too many foods that are high in purines. These foods include organ meats, some seafood, and beer. Trauma or stress can bring on an attack. What increases the risk? Having a family history of gout. Being male and middle-aged. Being male and having gone through menopause. Having an organ transplant. Taking certain medicines. Having certain conditions, such as: Being very overweight (obese). Lead poisoning. Kidney disease. A skin condition called psoriasis. Other risks include: Losing weight too quickly. Not having enough water in the body (being dehydrated). Drinking alcohol, especially beer. Drinking beverages that are sweetened with a type of sugar called fructose. What are the signs or symptoms? An attack of acute gout often starts at night and usually happens in just one joint. The most common place is the big toe. Other joints that may be affected include joints of the feet, ankle, knee, fingers, wrist, or elbow. Symptoms may include: Very bad pain. Warmth. Swelling. Stiffness. Tenderness. The affected joint may be very painful to  touch. Shiny, red, or purple skin. Chills and fever. Chronic gout may cause symptoms more often. More joints may be involved. You may also have white or yellow lumps (tophi) on your hands or feet or in other areas near your joints. How is this treated? Treatment for an acute attack may include medicines for pain and swelling, such as: NSAIDs, such as ibuprofen. Steroids taken by mouth or injected into a joint. Colchicine. This can be given by mouth or through an IV tube. Treatment to prevent future attacks may include: Taking small doses of NSAIDs or colchicine daily. Using a medicine that reduces uric acid levels in your blood, such as allopurinol. Making changes to your diet. You may need to see a food expert (dietitian) about what to eat and drink to prevent gout. Follow these instructions at home: During a gout attack  If told, put ice on the painful area. To do this: Put ice in a plastic bag. Place a towel between your skin and the bag. Leave the ice on for 20 minutes, 2-3 times a day. Take off the ice if your skin turns bright red. This is very important. If you cannot feel pain, heat, or cold, you have a greater risk of damage to the area. Raise the painful joint above the level of your heart as often as you can. Rest the joint as much as possible. If the joint is in your leg, you may be given crutches. Follow instructions from your doctor about what you cannot eat or drink. Avoiding future gout attacks Eat  a low-purine diet. Avoid foods and drinks such as: Liver. Kidney. Anchovies. Asparagus. Herring. Mushrooms. Mussels. Beer. Stay at a healthy weight. If you want to lose weight, talk with your doctor. Do not lose weight too fast. Start or continue an exercise plan as told by your doctor. Eating and drinking Avoid drinks sweetened by fructose. Drink enough fluids to keep your pee (urine) pale yellow. If you drink alcohol: Limit how much you have to: 0-1 drink a day  for women who are not pregnant. 0-2 drinks a day for men. Know how much alcohol is in a drink. In the [place] one drink equals one 12 oz bottle of beer (355 mL), one 5 oz glass of wine (148 mL), or one 1 oz glass of hard liquor (44 mL). General instructions Take over-the-counter and prescription medicines only as told by your doctor. Ask your doctor if you should avoid driving or using machines while you are taking your medicine. Return to your normal activities when your doctor says that it is safe. Keep all follow-up visits. Where to find more information [employer]: www.niams.http://www.myers.net/ Contact a doctor if: You have another gout attack. You still have symptoms of a gout attack after 10 days of treatment. You have problems (side effects) because of your medicines. You have chills or a fever. You have burning pain when you pee (urinate). You have pain in your lower back or belly. Get help right away if: You have very bad pain. Your pain cannot be controlled. You cannot pee. Summary Gout is painful swelling of the joints. The most common site of pain is the big toe, but it can affect other joints. Medicines and avoiding some foods can help to prevent and treat gout attacks. This information is not intended to replace advice given to you by your health care provider. Make sure you discuss any questions you have with your health care provider. Document Revised: 10/13/2020 Document Reviewed: 10/13/2020 Elsevier Patient Education  2024 [employer].

## 2022-07-13 ENCOUNTER — Ambulatory Visit: Payer: Self-pay | Admitting: Nurse Practitioner

## 2022-07-13 LAB — URIC ACID: Uric Acid: 4.7 mg/dL (ref 3.8–8.4)

## 2022-07-13 LAB — PSA: Prostate Specific Ag, Serum: 0.5 ng/mL (ref 0.0–4.0)

## 2022-07-25 ENCOUNTER — Emergency Department (HOSPITAL_COMMUNITY): Payer: No Typology Code available for payment source

## 2022-07-25 ENCOUNTER — Encounter (HOSPITAL_COMMUNITY): Payer: Self-pay

## 2022-07-25 ENCOUNTER — Emergency Department (HOSPITAL_COMMUNITY)
Admission: EM | Admit: 2022-07-25 | Discharge: 2022-07-25 | Disposition: A | Discharge status: Home / Self Care | Payer: No Typology Code available for payment source | Attending: Emergency Medicine | Admitting: Emergency Medicine

## 2022-07-25 ENCOUNTER — Other Ambulatory Visit: Payer: Self-pay

## 2022-07-25 DIAGNOSIS — M79671 Pain in right foot: Secondary | ICD-10-CM | POA: Diagnosis present

## 2022-07-25 DIAGNOSIS — Z79899 Other long term (current) drug therapy: Secondary | ICD-10-CM | POA: Insufficient documentation

## 2022-07-25 DIAGNOSIS — E039 Hypothyroidism, unspecified: Secondary | ICD-10-CM | POA: Diagnosis not present

## 2022-07-25 LAB — CBC
HCT: 38.7 % — ABNORMAL LOW (ref 39.0–52.0)
Hemoglobin: 12.7 g/dL — ABNORMAL LOW (ref 13.0–17.0)
MCH: 29.3 pg (ref 26.0–34.0)
MCHC: 32.8 g/dL (ref 30.0–36.0)
MCV: 89.4 fL (ref 80.0–100.0)
Platelets: 254 10*3/uL (ref 150–400)
RBC: 4.33 MIL/uL (ref 4.22–5.81)
RDW: 13.2 % (ref 11.5–15.5)
WBC: 4.3 10*3/uL (ref 4.0–10.5)
nRBC: 0 % (ref 0.0–0.2)

## 2022-07-25 LAB — BASIC METABOLIC PANEL
Anion gap: 9 (ref 5–15)
BUN: 11 mg/dL (ref 6–20)
CO2: 21 mmol/L — ABNORMAL LOW (ref 22–32)
Calcium: 8.8 mg/dL — ABNORMAL LOW (ref 8.9–10.3)
Chloride: 109 mmol/L (ref 98–111)
Creatinine, Ser: 0.88 mg/dL (ref 0.61–1.24)
GFR, Estimated: >60 mL/min (ref >60)
Glucose, Bld: 120 mg/dL — ABNORMAL HIGH (ref 70–99)
Potassium: 4 mmol/L (ref 3.5–5.1)
Sodium: 139 mmol/L (ref 135–145)

## 2022-07-25 LAB — URIC ACID: Uric Acid, Serum: 5 mg/dL (ref 3.7–8.6)

## 2022-07-25 MED ORDER — IBUPROFEN 400 MG PO TABS
600.0000 mg | ORAL_TABLET | Freq: Once | ORAL | Status: AC
  Administered 2022-07-25: 600 mg via ORAL
  Filled 2022-07-25: qty 1

## 2022-07-25 MED ORDER — ACETAMINOPHEN 500 MG PO TABS
1000.0000 mg | ORAL_TABLET | Freq: Once | ORAL | Status: AC
  Administered 2022-07-25: 1000 mg via ORAL
  Filled 2022-07-25: qty 2

## 2022-07-25 MED ORDER — OXYCODONE-ACETAMINOPHEN 5-325 MG PO TABS: 1.0000 | ORAL_TABLET | Freq: Four times a day (QID) | ORAL | 0 refills | Status: DC | PRN

## 2022-07-25 NOTE — ED Triage Notes (Signed)
Pt arrives to ED c/o right foot swelling that has been swelling x 3 weeks. Pt was seen for this issue and given prednisone. Pt states that swelling has gotten worse and starting to spread up calf. No injury or trauma reported

## 2022-07-25 NOTE — ED Provider Notes (Signed)
[place] EMERGENCY DEPARTMENT AT [facility] Provider Note   CSN: [id number] Arrival date & time: 07/25/22  1959     History  Chief Complaint  Patient presents with   Leg Swelling    Troy Alvarado is a 47 y.o. male.  Patient is a 47 year old male with a past medical history of hypothyroidism and venous insufficiency presenting to the emergency department with foot pain.  The patient states that he has had a pain in his right mid foot for about the last 3 weeks.  He states that he did drop something on his foot a while ago but that the pain did not immediately start after that.  He denies any other trauma or falls.  He denies any numbness or weakness.  He states that he has had swelling around the area.  He was seen in urgent care and they thought that it might be but and was prescribed a course of steroids but states that his symptoms did not improve.  He states that he does wear compression stockings for his venous insufficiency but this does not appear to be related to this and he was not having the pain in his foot when he saw his vascular doctor.  He denies any fevers or chills, redness or rash to the foot.  The history is provided by the patient and the spouse.       Home Medications Prior to Admission medications   Medication Sig Start Date End Date Taking? Authorizing Provider  levothyroxine (SYNTHROID) 75 MCG tablet Take 1 tablet (75 mcg total) by mouth daily. 04/27/22 04/27/23  Ivonne Andrew, NP  Multiple Vitamins-Minerals (MENS 50+ MULTIVITAMIN) TABS Take 1 tablet by mouth daily. 04/20/22   Ellsworth Lennox, PA-C  Vitamin D, Ergocalciferol, (DRISDOL) 1.25 MG (50000 UNIT) CAPS capsule TAKE 1 CAPSULE (50,000 UNITS TOTAL) BY MOUTH EVERY 7 (SEVEN) DAYS 06/05/22   Ivonne Andrew, NP      Allergies    Patient has no known allergies.    Review of Systems   Review of Systems  Physical Exam Updated Vital Signs BP (!) 134/90 (BP Location: Right Arm)   Pulse 98   Temp  98.8 F (37.1 C)   Resp 18   Ht 5\' 11"  (1.803 m)   Wt 109.8 kg   SpO2 94%   BMI 33.75 kg/m  Physical Exam Vitals and nursing note reviewed.  Constitutional:      General: He is not in acute distress.    Appearance: Normal appearance.  HENT:     Head: Normocephalic and atraumatic.     Nose: Nose normal.     Mouth/Throat:     Mouth: Mucous membranes are moist.  Eyes:     Extraocular Movements: Extraocular movements intact.  Cardiovascular:     Rate and Rhythm: Normal rate.     Pulses: Normal pulses.  Pulmonary:     Effort: Pulmonary effort is normal.  Musculoskeletal:        General: Tenderness (Dorsum of R mid foot with mild overlying swelling, no overlying erythema, warmth or bruising) present. Normal range of motion.     Cervical back: Normal range of motion.     Right lower leg: No edema.     Left lower leg: No edema.  Skin:    General: Skin is warm and dry.  Neurological:     General: No focal deficit present.     Mental Status: He is alert and oriented to person, place, and time.  Sensory: No sensory deficit.     Motor: No weakness.  Psychiatric:        Mood and Affect: Mood normal.        Behavior: Behavior normal.     ED Results / Procedures / Treatments   Labs (all labs ordered are listed, but only abnormal results are displayed) Labs Reviewed  CBC - Abnormal; Notable for the following components:      Result Value   Hemoglobin 12.7 (*)    HCT 38.7 (*)    All other components within normal limits  BASIC METABOLIC PANEL - Abnormal; Notable for the following components:   CO2 21 (*)    Glucose, Bld 120 (*)    Calcium 8.8 (*)    All other components within normal limits  URIC ACID    EKG None  Radiology DG Foot Complete Right  Result Date: 07/25/2022 CLINICAL DATA:  Foot pain swelling EXAM: RIGHT FOOT COMPLETE - 3+ VIEW COMPARISON:  None Available. FINDINGS: There is no evidence of fracture or dislocation. There is no evidence of arthropathy or  other focal bone abnormality. Soft tissues are unremarkable. IMPRESSION: Negative. Electronically Signed   By: Jasmine Pang M.D.   On: 07/25/2022 21:00    Procedures Procedures    Medications Ordered in ED Medications  acetaminophen (TYLENOL) tablet 1,000 mg (1,000 mg Oral Given 07/25/22 2207)  ibuprofen (ADVIL) tablet 600 mg (600 mg Oral Given 07/25/22 2207)    ED Course/ Medical Decision Making/ A&P                             Medical Decision Making This patient presents to the ED with chief complaint(s) of Foot pain with pertinent past medical history of hypothyroid, venous insufficieny  which further complicates the presenting complaint. The complaint involves an extensive differential diagnosis and also carries with it a high risk of complications and morbidity.    The differential diagnosis includes fracture/dislocation, ligamentous injury, contusion, no swelling or erythema to the joints making a gout or other arthritis less likely, no calf or leg swelling or pain making DVT unlikely  Additional history obtained: Additional history obtained from N/A Records reviewed outpatient urgent care and vascular records  ED Course and Reassessment: Patient was initially evaluated in triage and had labs and foot x-ray performed.  Patient's labs are within normal range including a normal uric acid level.  X-ray of his foot showed no acute traumatic injury and no evidence of arthritis.  Unclear etiology of patient's foot pain but considering possible ligamentous injury or Morton's neuroma.  He was recommended continued Tylenol and Motrin for pain was given an Ace wrap for the swelling and comfort and was recommended to ice and elevate his foot to help with the swelling.  He was recommended to follow-up with podiatry for further evaluation.  Independent labs interpretation:  The following labs were independently interpreted: Within normal range  Independent visualization of imaging: - I  independently visualized the following imaging with scope of interpretation limited to determining acute life threatening conditions related to emergency care: Right foot x-ray, which revealed no acute disease  Consultation: - Consulted or discussed management/test interpretation w/ external professional: N/A  Consideration for admission or further workup: Patient has no emergent conditions requiring admission or further work-up at this time and is stable for discharge home with primary care and podiatry follow-up  Social Determinants of health: N/A    Risk OTC drugs.  Final Clinical Impression(s) / ED Diagnoses Final diagnoses:  Right foot pain    Rx / DC Orders ED Discharge Orders     None         Rexford Maus, DO 07/25/22 2215

## 2022-07-25 NOTE — Discharge Instructions (Signed)
You were seen in the emergency department for your foot pain.  Your x-ray showed no obvious broken bones or arthritis and this did not appear to be consistent with gout.  You had no signs of infection.  It is unclear what is causing the pain but you can use the Ace wrap, ice and elevate your foot to help with the swelling and continue to take Tylenol and Motrin as needed for pain and both can be taken up to every 6 hours.  You should follow-up with podiatry to have your foot rechecked and you may need further imaging to determine the cause of the pain.  You should return to the emergency department if you have redness to your foot, you are having fevers, you are unable to walk or if you have any other new or concerning symptoms.

## 2022-07-26 ENCOUNTER — Telehealth: Payer: Self-pay

## 2022-07-26 NOTE — Transitions of Care (Post Inpatient/ED Visit) (Cosign Needed)
   07/26/2022  Name: Troy Alvarado MRN: [id number] DOB: Apr 16, 1975  Today's TOC FU Call Status: Today's TOC FU Call Status:: Successful TOC FU Call Competed TOC FU Call Complete Date: 07/26/22  Transition Care Management Follow-up Telephone Call Date of Discharge: 07/25/22 Discharge Facility: [facility]) Type of Discharge: Emergency Department Reason for ED Visit: Orthopedic Conditions (foot pain) How have you been since you were released from the hospital?: Better Any questions or concerns?: No  Items Reviewed: Did you receive and understand the discharge instructions provided?: Yes Medications obtained,verified, and reconciled?: Yes (Medications Reviewed) Any new allergies since your discharge?: No Dietary orders reviewed?: NA Do you have support at home?: Yes People in Home: other relative(s)  Medications Reviewed Today: Medications Reviewed Today     Reviewed by Renelda Loma, RMA (Registered Medical Assistant) on 07/26/22 at [phone]  Med List Status: <None>   Medication Order Taking? Sig Documenting Provider Last Dose Status Informant  levothyroxine (SYNTHROID) 75 MCG tablet [id number] Yes Take 1 tablet (75 mcg total) by mouth daily. Ivonne Andrew, NP Taking Active   Multiple Vitamins-Minerals (MENS 50+ MULTIVITAMIN) TABS [id number] Yes Take 1 tablet by mouth daily. Ellsworth Lennox, PA-C Taking Active   oxyCODONE-acetaminophen (PERCOCET/ROXICET) 5-325 MG tablet [id number] Yes Take 1 tablet by mouth every 6 (six) hours as needed for severe pain. Rexford Maus, DO Taking Active   Vitamin D, Ergocalciferol, (DRISDOL) 1.25 MG (50000 UNIT) CAPS capsule [id number] No TAKE 1 CAPSULE (50,000 UNITS TOTAL) BY MOUTH EVERY 7 (SEVEN) DAYS  Patient not taking: Reported on 07/26/2022   Ivonne Andrew, NP Not Taking Active             Home Care and Equipment/Supplies: Were Home Health Services Ordered?: NA Any new equipment or medical supplies ordered?: NA  Functional  Questionnaire: Do you need assistance with bathing/showering or dressing?: No Do you need assistance with meal preparation?: No Do you need assistance with eating?: No Do you have difficulty maintaining continence: No Do you need assistance with getting out of bed/getting out of a chair/moving?: No Do you have difficulty managing or taking your medications?: No  Follow up appointments reviewed: PCP Follow-up appointment confirmed?: Yes Date of PCP follow-up appointment?: 08/14/22 Follow-up Provider: Angus Seller Specialist [facility] Follow-up appointment confirmed?: NA Do you need transportation to your follow-up appointment?: No Do you understand care options if your condition(s) worsen?: Yes-patient verbalized understanding    SIGNATURE Renelda Loma RMA

## 2022-08-11 ENCOUNTER — Ambulatory Visit (INDEPENDENT_AMBULATORY_CARE_PROVIDER_SITE_OTHER): Payer: No Typology Code available for payment source | Admitting: Podiatry

## 2022-08-11 DIAGNOSIS — M19071 Primary osteoarthritis, right ankle and foot: Secondary | ICD-10-CM | POA: Diagnosis not present

## 2022-08-11 DIAGNOSIS — M778 Other enthesopathies, not elsewhere classified: Secondary | ICD-10-CM | POA: Diagnosis not present

## 2022-08-11 MED ORDER — METHYLPREDNISOLONE 4 MG PO TBPK: ORAL_TABLET | ORAL | 0 refills | Status: DC

## 2022-08-11 NOTE — Progress Notes (Unsigned)
Subjective:   Patient ID: Troy Alvarado, male   DOB: 47 y.o.   MRN: [id number]   HPI Chief Complaint  Patient presents with   Foot Pain    Pt states he has had swelling for about 1 month now on the top of his right foot causing lots of pain to walk he states he is constantly hurting he has xrays done on 07/25/22 They checked for gout but said they did not see anything so he came here    47 year old male presents the office for above concerns.  No recent injuries that he reports.  Review of Systems  All other systems reviewed and are negative.   Past Medical History:  Diagnosis Date   Chronic low back pain    Family history of prostate cancer    History of hyperthyroidism 2009   prior oral radioablation therapy   Hypothyroidism    s/p ablation   Impaired fasting blood sugar    Obesity    Obesity     Past Surgical History:  Procedure Laterality Date   NO PAST SURGERIES  07/2015     Current Outpatient Medications:    methylPREDNISolone (MEDROL DOSEPAK) 4 MG TBPK tablet, Take as directed (Patient not taking: Reported on 08/14/2022), Disp: 21 tablet, Rfl: 0   levothyroxine (SYNTHROID) 75 MCG tablet, Take 1 tablet (75 mcg total) by mouth daily., Disp: 30 tablet, Rfl: 11   Multiple Vitamins-Minerals (MENS 50+ MULTIVITAMIN) TABS, Take 1 tablet by mouth daily. (Patient not taking: Reported on 08/14/2022), Disp: 30 tablet, Rfl: 3   oxyCODONE-acetaminophen (PERCOCET/ROXICET) 5-325 MG tablet, Take 1 tablet by mouth every 6 (six) hours as needed for severe pain. (Patient not taking: Reported on 08/14/2022), Disp: 7 tablet, Rfl: 0   Vitamin D, Ergocalciferol, (DRISDOL) 1.25 MG (50000 UNIT) CAPS capsule, TAKE 1 CAPSULE (50,000 UNITS TOTAL) BY MOUTH EVERY 7 (SEVEN) DAYS (Patient not taking: Reported on 07/26/2022), Disp: 5 capsule, Rfl: 0  No Known Allergies       Objective:  Physical Exam  General: AAO x3, NAD  Dermatological: Skin is warm, dry and supple bilateral. There are no open  sores, no preulcerative lesions, no rash or signs of infection present.  Vascular: Dorsalis Pedis artery and Posterior Tibial artery pedal pulses are 2/4 bilateral with immedate capillary fill time.  There is no pain with calf compression, swelling, warmth, erythema.   Neruologic: Grossly intact via light touch bilateral.   Musculoskeletal: There is tenderness palpation of the dorsal aspect of the midfoot.  Tenderness also in the Lisfranc joint.  There is no significant pain to the ankle itself.  There are some mild edema present to the foot there is no erythema or warmth.  Flexor, extensor tendons appear to be intact clinically.     Assessment:   47 year old male capsulitis, right foot pain     Plan:  -Treatment options discussed including all alternatives, risks, and complications -Etiology of symptoms were discussed -I did detailed review of the x-rays on the right foot from the urgent care.  Prescribed a Medrol Dosepak once complete and start meloxicam.  Discussed points to proximal shoes.  Given ongoing symptoms of recommended MRI which is ordered for him today.  Return for MRI results.  Vivi Barrack DPM

## 2022-08-14 ENCOUNTER — Encounter: Payer: Self-pay | Admitting: Nurse Practitioner

## 2022-08-14 ENCOUNTER — Ambulatory Visit (INDEPENDENT_AMBULATORY_CARE_PROVIDER_SITE_OTHER): Payer: No Typology Code available for payment source | Admitting: Nurse Practitioner

## 2022-08-14 VITALS — BP 129/81 | HR 76 | Temp 97.3°F | Wt 241.2 lb

## 2022-08-14 DIAGNOSIS — M79674 Pain in right toe(s): Secondary | ICD-10-CM

## 2022-08-14 DIAGNOSIS — M7989 Other specified soft tissue disorders: Secondary | ICD-10-CM

## 2022-08-14 MED ORDER — KETOROLAC TROMETHAMINE 60 MG/2ML IM SOLN
60.0000 mg | Freq: Once | INTRAMUSCULAR | Status: AC
  Administered 2022-08-14: 60 mg via INTRAMUSCULAR

## 2022-08-14 NOTE — Progress Notes (Signed)
@Patient  ID: Troy Alvarado, male    DOB: September 11, 1975, 47 y.o.   MRN: [id number]  Chief Complaint  Patient presents with   Hospitalization Follow-up    Referring provider: Ivonne Andrew, NP   HPI  Patient presents today for hospital follow-up.  He was seen in the ED on 07/25/2022 for right foot pain and swelling.  Patient was referred to podiatry.  He was prescribed prednisone by podiatrist but has not started this medication yet.  His foot continues to stay swollen and tender.  The podiatrist has ordered an MRI of his foot.  We will give Toradol injection today.  We discussed that patient does still need to take prednisone as directed podiatry but may start this tomorrow. Denies f/c/s, n/v/d, hemoptysis, PND, leg swelling Denies chest pain or edema     No Known Allergies  Immunization History  Administered Date(s) Administered   Influenza,inj,Quad PF,6+ Mos 10/24/2013, 12/06/2017   Tdap 09/24/2013    Past Medical History:  Diagnosis Date   Chronic low back pain    Family history of prostate cancer    History of hyperthyroidism 2009   prior oral radioablation therapy   Hypothyroidism    s/p ablation   Impaired fasting blood sugar    Obesity    Obesity     Tobacco History: Social History   Tobacco Use  Smoking Status Never  Smokeless Tobacco Never  Tobacco Comments   Currently smokes marijuanna.   Counseling given: Not Answered Tobacco comments: Currently smokes marijuanna.   Outpatient Encounter Medications as of 08/14/2022  Medication Sig   levothyroxine (SYNTHROID) 75 MCG tablet Take 1 tablet (75 mcg total) by mouth daily.   methylPREDNISolone (MEDROL DOSEPAK) 4 MG TBPK tablet Take as directed (Patient not taking: Reported on 08/14/2022)   Multiple Vitamins-Minerals (MENS 50+ MULTIVITAMIN) TABS Take 1 tablet by mouth daily. (Patient not taking: Reported on 08/14/2022)   oxyCODONE-acetaminophen (PERCOCET/ROXICET) 5-325 MG tablet Take 1 tablet by mouth every 6  (six) hours as needed for severe pain. (Patient not taking: Reported on 08/14/2022)   Vitamin D, Ergocalciferol, (DRISDOL) 1.25 MG (50000 UNIT) CAPS capsule TAKE 1 CAPSULE (50,000 UNITS TOTAL) BY MOUTH EVERY 7 (SEVEN) DAYS (Patient not taking: Reported on 07/26/2022)   [EXPIRED] ketorolac (TORADOL) injection 60 mg    No facility-administered encounter medications on file as of 08/14/2022.     Review of Systems  Review of Systems  Constitutional: Negative.   HENT: Negative.    Cardiovascular: Negative.   Gastrointestinal: Negative.   Musculoskeletal:        Right foot pain and swelling  Allergic/Immunologic: Negative.   Neurological: Negative.   Psychiatric/Behavioral: Negative.         Physical Exam  BP 129/81   Pulse 76   Temp (!) 97.3 F (36.3 C)   Wt 241 lb 3.2 oz (109.4 kg)   SpO2 100%   BMI 33.64 kg/m   Wt Readings from Last 5 Encounters:  08/14/22 241 lb 3.2 oz (109.4 kg)  07/25/22 242 lb (109.8 kg)  07/12/22 240 lb 3.2 oz (109 kg)  06/23/22 240 lb 3.2 oz (109 kg)  06/15/22 236 lb 8 oz (107.3 kg)     Physical Exam Vitals and nursing note reviewed.  Constitutional:      General: He is not in acute distress.    Appearance: He is well-developed.  Cardiovascular:     Rate and Rhythm: Normal rate and regular rhythm.  Pulmonary:     Effort: Pulmonary  effort is normal.     Breath sounds: Normal breath sounds.  Musculoskeletal:     Right foot: Decreased range of motion.  Skin:    General: Skin is warm and dry.  Neurological:     Mental Status: He is alert and oriented to person, place, and time.      Lab Results:  CBC    Component Value Date/Time   WBC 4.3 07/25/2022 2038   RBC 4.33 07/25/2022 2038   HGB 12.7 (L) 07/25/2022 2038   HGB 13.6 04/26/2022 1008   HCT 38.7 (L) 07/25/2022 2038   HCT 43.0 04/26/2022 1008   PLT 254 07/25/2022 2038   PLT 260 04/26/2022 1008   MCV 89.4 07/25/2022 2038   MCV 91 04/26/2022 1008   MCH 29.3 07/25/2022 2038    MCHC 32.8 07/25/2022 2038   RDW 13.2 07/25/2022 2038   RDW 13.2 04/26/2022 1008   LYMPHSABS 1.8 04/09/2021 1023   MONOABS 0.4 04/09/2021 1023   EOSABS 0.2 04/09/2021 1023   BASOSABS 0.1 04/09/2021 1023    BMET    Component Value Date/Time   NA 139 07/25/2022 2038   NA 141 04/26/2022 1008   K 4.0 07/25/2022 2038   CL 109 07/25/2022 2038   CO2 21 (L) 07/25/2022 2038   GLUCOSE 120 (H) 07/25/2022 2038   BUN 11 07/25/2022 2038   BUN 11 04/26/2022 1008   CREATININE 0.88 07/25/2022 2038   CREATININE 0.96 07/30/2015 0916   CALCIUM 8.8 (L) 07/25/2022 2038   GFRNONAA >60 07/25/2022 2038   GFRAA >60 11/22/2018 1146    BNP No results found for: "BNP"  ProBNP No results found for: "PROBNP"  Imaging: DG Foot Complete Right  Result Date: 07/25/2022 CLINICAL DATA:  Foot pain swelling EXAM: RIGHT FOOT COMPLETE - 3+ VIEW COMPARISON:  None Available. FINDINGS: There is no evidence of fracture or dislocation. There is no evidence of arthropathy or other focal bone abnormality. Soft tissues are unremarkable. IMPRESSION: Negative. Electronically Signed   By: Jasmine Pang M.D.   On: 07/25/2022 21:00     Assessment & Plan:   Pain and swelling of toe of right foot - ketorolac (TORADOL) injection 60 mg -Continue to follow with podiatry - MRI has been ordered  Follow up:  Follow up as scheduled     Ivonne Andrew, NP 08/14/2022

## 2022-08-14 NOTE — Assessment & Plan Note (Signed)
-   ketorolac (TORADOL) injection 60 mg -Continue to follow with podiatry - MRI has been ordered  Follow up:  Follow up as scheduled

## 2022-08-14 NOTE — Patient Instructions (Addendum)
1. Pain and swelling of toe of right foot  - ketorolac (TORADOL) injection 60 mg -Continue to follow with podiatry - MRI has been ordered  Follow up:  Follow up as scheduled

## 2022-08-18 ENCOUNTER — Other Ambulatory Visit: Payer: Self-pay | Admitting: Nurse Practitioner

## 2022-08-29 ENCOUNTER — Encounter: Payer: Self-pay | Admitting: Podiatry

## 2022-09-03 ENCOUNTER — Ambulatory Visit
Admission: RE | Admit: 2022-09-03 | Discharge: 2022-09-03 | Disposition: A | Discharge status: Home / Self Care | Payer: No Typology Code available for payment source | Source: Ambulatory Visit | Attending: Podiatry | Admitting: Podiatry

## 2022-09-03 DIAGNOSIS — M19071 Primary osteoarthritis, right ankle and foot: Secondary | ICD-10-CM

## 2022-09-11 ENCOUNTER — Ambulatory Visit (INDEPENDENT_AMBULATORY_CARE_PROVIDER_SITE_OTHER): Payer: No Typology Code available for payment source | Admitting: Podiatry

## 2022-09-11 ENCOUNTER — Encounter: Payer: Self-pay | Admitting: Podiatry

## 2022-09-11 DIAGNOSIS — M84374D Stress fracture, right foot, subsequent encounter for fracture with routine healing: Secondary | ICD-10-CM

## 2022-09-11 DIAGNOSIS — E559 Vitamin D deficiency, unspecified: Secondary | ICD-10-CM | POA: Diagnosis not present

## 2022-09-11 NOTE — Progress Notes (Signed)
Subjective: Chief Complaint  Patient presents with   Arthritis    Pt stated that he his here to go over his MRI results he stated that he still has pain and swelling   47 year old male presents the office today with concerns.  States the swelling has come down he still having some pain mostly off of his foot.  He presents today to get the MRI results as well.  Objective: AAO x3, NAD DP/PT pulses palpable bilaterally, CRT less than 3 seconds Still tender to palpation dorsal aspect of the right midfoot, forefoot.  Some localized edema there is no erythema or warmth.  No contents the dorsal central aspect of the midfoot but also along the fourth and fifth metatarsals.  Flexor, extensor tendons are intact.  MMT 5/5. No pain with calf compression, swelling, warmth, erythema  Assessment: Stress fracture right foot  Plan: -All treatment options discussed with the patient including all alternatives, risks, complications.  -I called and was able to get the MRI report while he was in the office and we reviewed this today.  Given fracture recommend elevation and cam boot.  It is not able to weigh him at work he can continue with a graphite insert.  Discussed Voltaren gel.  Ice. -History of low vitamin D and we will recheck this. -Patient encouraged to call the office with any questions, concerns, change in symptoms.   Vivi Barrack DPM

## 2022-09-12 LAB — VITAMIN D 25 HYDROXY (VIT D DEFICIENCY, FRACTURES): Vit D, 25-Hydroxy: 27.8 ng/mL — ABNORMAL LOW (ref 30.0–100.0)

## 2022-09-20 ENCOUNTER — Ambulatory Visit (INDEPENDENT_AMBULATORY_CARE_PROVIDER_SITE_OTHER): Payer: No Typology Code available for payment source | Admitting: Vascular Surgery

## 2022-09-20 ENCOUNTER — Encounter: Payer: Self-pay | Admitting: Vascular Surgery

## 2022-09-20 VITALS — BP 124/86 | HR 84 | Temp 98.5°F | Resp 18 | Ht 71.0 in | Wt 237.1 lb

## 2022-09-20 DIAGNOSIS — I872 Venous insufficiency (chronic) (peripheral): Secondary | ICD-10-CM

## 2022-09-20 NOTE — Progress Notes (Signed)
REASON FOR VISIT:   Follow-up of chronic venous insufficiency.  MEDICAL ISSUES:   CHRONIC VENOUS INSUFFICIENCY: This patient was having cramps in both legs.  These are improved now that has been started on vitamin D.  He has mild leg swelling.  He does not have any significant varicose veins.  His duplex scan does show some deep venous reflux in the common femoral vein but really minimal superficial venous reflux.  Thus currently he is not a candidate for laser ablation of the right great saphenous vein.  We have discussed conservative measures.  I have encouraged him to avoid prolonged sitting and standing.  We have discussed the importance of exercise specifically walking and water aerobics.  He will continue to wear his compression stockings although a knee-high stocking may be more convenient at work.  We have discussed the importance of daily leg elevation and the proper positioning for this.  If his symptoms worsen or he develops any large varicose veins he will let us know and we can certainly reevaluate in the future.  HPI:   Troy Alvarado is a pleasant 47 y.o. male who was seen by Graceann Congress, PA on 06/15/2022 with right lower extremity swelling.  This is been going on for a couple years.  He is also having some cramping.  He had no previous history of DVT.  He was prescribed thigh-high compression stockings with a gradient of 20 to 30 mmHg, encouraged to exercise, and elevate his legs.  He comes in for 1-month follow-up visit.  Since he was seen last he has been started on vitamin D and his cramps in his legs have improved.  He does have a fracture stress fracture in his right foot.  This has limited his walking some.  He has been wearing his thigh-high compression stockings which helped his symptoms some.  He denies any previous history of DVT.  Past Medical History:  Diagnosis Date   Chronic low back pain    Family history of prostate cancer    History of hyperthyroidism 2009    prior oral radioablation therapy   Hypothyroidism    s/p ablation   Impaired fasting blood sugar    Obesity    Obesity     Family History  Problem Relation Age of Onset   Cirrhosis Mother        died of cirrhosis   Alcohol abuse Mother    Crohn's disease Mother    Stroke Mother    Hypertension Father    Cancer Father        33   Diabetes Brother    Heart disease Neg Hx     SOCIAL HISTORY: Social History   Tobacco Use   Smoking status: Never   Smokeless tobacco: Never   Tobacco comments:    Currently smokes marijuanna.  Substance Use Topics   Alcohol use: Yes    Alcohol/week: 8.0 standard drinks of alcohol    Types: 5 Cans of beer, 3 Shots of liquor per week    No Known Allergies  Current Outpatient Medications  Medication Sig Dispense Refill   levothyroxine (SYNTHROID) 75 MCG tablet Take 1 tablet (75 mcg total) by mouth daily. 30 tablet 11   Vitamin D, Ergocalciferol, (DRISDOL) 1.25 MG (50000 UNIT) CAPS capsule TAKE 1 CAPSULE (50,000 UNITS TOTAL) BY MOUTH EVERY 7 (SEVEN) DAYS 5 capsule 0   azithromycin (ZITHROMAX) 250 MG tablet TAKE 2 TABLETS BY MOUTH TODAY, THEN TAKE 1 TABLET DAILY FOR 4 DAYS (  Patient not taking: Reported on 09/20/2022)     methylPREDNISolone (MEDROL DOSEPAK) 4 MG TBPK tablet Take as directed (Patient not taking: Reported on 08/14/2022) 21 tablet 0   Multiple Vitamins-Minerals (MENS 50+ MULTIVITAMIN) TABS Take 1 tablet by mouth daily. (Patient not taking: Reported on 08/14/2022) 30 tablet 3   oxyCODONE-acetaminophen (PERCOCET/ROXICET) 5-325 MG tablet Take 1 tablet by mouth every 6 (six) hours as needed for severe pain. (Patient not taking: Reported on 08/14/2022) 7 tablet 0   No current facility-administered medications for this visit.    REVIEW OF SYSTEMS:  [X]  denotes positive finding, [ ]  denotes negative finding Cardiac  Comments:  Chest pain or chest pressure:    Shortness of breath upon exertion:    Short of breath when lying flat:     Irregular heart rhythm:        Vascular    Pain in calf, thigh, or hip brought on by ambulation:    Pain in feet at night that wakes you up from your sleep:     Blood clot in your veins:    Leg swelling:  x       Pulmonary    Oxygen at home:    Productive cough:     Wheezing:         Neurologic    Sudden weakness in arms or legs:     Sudden numbness in arms or legs:     Sudden onset of difficulty speaking or slurred speech:    Temporary loss of vision in one eye:     Problems with dizziness:         Gastrointestinal    Blood in stool:     Vomited blood:         Genitourinary    Burning when urinating:     Blood in urine:        Psychiatric    Major depression:         Hematologic    Bleeding problems:    Problems with blood clotting too easily:        Skin    Rashes or ulcers:        Constitutional    Fever or chills:     PHYSICAL EXAM:   Vitals:   09/20/22 1311  BP: 124/86  Pulse: 84  Resp: 18  Temp: 98.5 F (36.9 C)  TempSrc: Temporal  SpO2: 96%  Weight: 237 lb 1.6 oz (107.5 kg)  Height: 5\' 11"  (1.803 m)    GENERAL: The patient is a well-nourished male, in no acute distress. The vital signs are documented above. CARDIAC: There is a regular rate and rhythm.  VASCULAR: I do not detect carotid bruits. He has palpable pedal pulses. He has mild bilateral lower extremity swelling. I do not see any large varicose veins. I did look at his right great saphenous vein myself with the SonoSite.  It was 4 to 5 mm throughout but did not have significant reflux except in the mid thigh. PULMONARY: There is good air exchange bilaterally without wheezing or rales. ABDOMEN: Soft and non-tender with normal pitched bowel sounds.  MUSCULOSKELETAL: There are no major deformities or cyanosis. NEUROLOGIC: No focal weakness or paresthesias are detected. SKIN: There are no ulcers or rashes noted. PSYCHIATRIC: The patient has a normal affect.  DATA:    I have reviewed  the venous duplex scan that was done on 06/15/2022.  This was of the right lower extremity only.  There was no evidence of  DVT.  There was deep venous reflux in the common femoral vein.  There was superficial venous reflux in the mid thigh with a diameter of 3.6 mm at this level.  There was also reflux at the saphenofemoral junction.  The results of the study are summarized in the diagram below.   Waverly Ferrari [facility] [phone]

## 2022-09-29 ENCOUNTER — Encounter: Payer: Self-pay | Admitting: Adult Health

## 2022-09-29 ENCOUNTER — Ambulatory Visit (INDEPENDENT_AMBULATORY_CARE_PROVIDER_SITE_OTHER): Payer: No Typology Code available for payment source | Admitting: Adult Health

## 2022-09-29 VITALS — BP 120/80 | HR 85 | Ht 71.0 in | Wt 240.0 lb

## 2022-09-29 DIAGNOSIS — G4733 Obstructive sleep apnea (adult) (pediatric): Secondary | ICD-10-CM

## 2022-09-29 NOTE — Assessment & Plan Note (Signed)
Severe obstructive sleep apnea-patient is having difficulty tolerating CPAP.  Will adjust CPAP pressure for comfort.  Change to auto CPAP 6 to 12 cm H2O.  Hopefully this will decrease his number of central events as well.  Will change to the DreamWear nasal mask as he is having difficulty with full facemask.  Recheck in 3 months.  - discussed how weight can impact sleep and risk for sleep disordered breathing - discussed options to assist with weight loss: combination of diet modification, cardiovascular and strength training exercises   - had an extensive discussion regarding the adverse health consequences related to untreated sleep disordered breathing - specifically discussed the risks for hypertension, coronary artery disease, cardiac dysrhythmias, cerebrovascular disease, and diabetes - lifestyle modification discussed   - discussed how sleep disruption can increase risk of accidents, particularly when driving - safe driving practices were discussed   Plan  Patient Instructions  Continue on CPAP at bedtime, goal was to wear all night long for at least 6 or more hours Change CPAP pressure to Auto CPAP 6-12cmH2O.  Try Dream wear nasal mask .  Work on healthy weight loss  Do not drive if sleepy  Follow up in 3 months and As needed

## 2022-09-29 NOTE — Progress Notes (Signed)
@Patient  ID: Troy Alvarado, male    DOB: 11-08-1975, 47 y.o.   MRN: [id number]  Chief Complaint  Patient presents with   Follow-up    Referring provider: Ivonne Andrew, NP  HPI: 47 year old male seen for sleep consult May 11, 2022 for snoring and daytime sleepiness found to have severe sleep apnea  TEST/EVENTS :  HST on May 24, 2022 that showed severe sleep apnea with AHI at 60/hour and SpO2 low at 81%.    09/29/2022 Follow up : OSA  Patient presents for a 55-month follow-up.  Patient has severe obstructive sleep apnea.  He has been started on CPAP recently.  Patient says he has been having difficulty tolerating CPAP.  Tries to wear it overnight but is not comfortable.  CPAP download shows 48% compliance.  Daily average usage at 5 hours.  Patient is on auto CPAP 5 to 20 cm H2O.  AHI 11.3/hour.,  Positive central events.  Says the mask is very uncomfortable is currently using a fullface mask.  Does feel that when he wears a CPAP he does feel less sleepy.  Does not have as many morning headaches.  Also not gasping for air.      No Known Allergies  Immunization History  Administered Date(s) Administered   Influenza,inj,Quad PF,6+ Mos 10/24/2013, 12/06/2017   Tdap 09/24/2013    Past Medical History:  Diagnosis Date   Chronic low back pain    Family history of prostate cancer    History of hyperthyroidism 2009   prior oral radioablation therapy   Hypothyroidism    s/p ablation   Impaired fasting blood sugar    Obesity    Obesity     Tobacco History: Social History   Tobacco Use  Smoking Status Never  Smokeless Tobacco Never  Tobacco Comments   Currently smokes marijuanna.   Counseling given: Not Answered Tobacco comments: Currently smokes marijuanna.   Outpatient Medications Prior to Visit  Medication Sig Dispense Refill   levothyroxine (SYNTHROID) 75 MCG tablet Take 1 tablet (75 mcg total) by mouth daily. 30 tablet 11   Multiple Vitamins-Minerals (MENS 50+  MULTIVITAMIN) TABS Take 1 tablet by mouth daily. 30 tablet 3   oxyCODONE-acetaminophen (PERCOCET/ROXICET) 5-325 MG tablet Take 1 tablet by mouth every 6 (six) hours as needed for severe pain. 7 tablet 0   Vitamin D, Ergocalciferol, (DRISDOL) 1.25 MG (50000 UNIT) CAPS capsule TAKE 1 CAPSULE (50,000 UNITS TOTAL) BY MOUTH EVERY 7 (SEVEN) DAYS 5 capsule 0   azithromycin (ZITHROMAX) 250 MG tablet TAKE 2 TABLETS BY MOUTH TODAY, THEN TAKE 1 TABLET DAILY FOR 4 DAYS (Patient not taking: Reported on 09/20/2022)     methylPREDNISolone (MEDROL DOSEPAK) 4 MG TBPK tablet Take as directed (Patient not taking: Reported on 08/14/2022) 21 tablet 0   No facility-administered medications prior to visit.     Review of Systems:   Constitutional:   No  weight loss, night sweats,  Fevers, chills,  +fatigue, or  lassitude.  HEENT:   No headaches,  Difficulty swallowing,  Tooth/dental problems, or  Sore throat,                No sneezing, itching, ear ache, nasal congestion, post nasal drip,   CV:  No chest pain,  Orthopnea, PND, swelling in lower extremities, anasarca, dizziness, palpitations, syncope.   GI  No heartburn, indigestion, abdominal pain, nausea, vomiting, diarrhea, change in bowel habits, loss of appetite, bloody stools.   Resp: No shortness of breath with exertion or  at rest.  No excess mucus, no productive cough,  No non-productive cough,  No coughing up of blood.  No change in color of mucus.  No wheezing.  No chest wall deformity  Skin: no rash or lesions.  GU: no dysuria, change in color of urine, no urgency or frequency.  No flank pain, no hematuria   MS:  No joint pain or swelling.  No decreased range of motion.  No back pain.    Physical Exam   GEN: A/Ox3; pleasant , NAD, well nourished    HEENT:  [place]/AT,  NOSE-clear, THROAT-clear, no lesions, no postnasal drip or exudate noted. Class 3 MP airway   NECK:  Supple w/ fair ROM; no JVD; normal carotid impulses w/o bruits; no thyromegaly  or nodules palpated; no lymphadenopathy.    RESP  Clear  P & A; w/o, wheezes/ rales/ or rhonchi. no accessory muscle use, no dullness to percussion  CARD:  RRR, no m/r/g, no peripheral edema, pulses intact, no cyanosis or clubbing.  GI:   Soft & nt; nml bowel sounds; no organomegaly or masses detected.   Musco: Warm bil, no deformities or joint swelling noted.   Neuro: alert, no focal deficits noted.    Skin: Warm, no lesions or rashes    Lab Results:  CBC    Component Value Date/Time   WBC 4.3 07/25/2022 2038   RBC 4.33 07/25/2022 2038   HGB 12.7 (L) 07/25/2022 2038   HGB 13.6 04/26/2022 1008   HCT 38.7 (L) 07/25/2022 2038   HCT 43.0 04/26/2022 1008   PLT 254 07/25/2022 2038   PLT 260 04/26/2022 1008   MCV 89.4 07/25/2022 2038   MCV 91 04/26/2022 1008   MCH 29.3 07/25/2022 2038   MCHC 32.8 07/25/2022 2038   RDW 13.2 07/25/2022 2038   RDW 13.2 04/26/2022 1008   LYMPHSABS 1.8 04/09/2021 1023   MONOABS 0.4 04/09/2021 1023   EOSABS 0.2 04/09/2021 1023   BASOSABS 0.1 04/09/2021 1023    BMET    Component Value Date/Time   NA 139 07/25/2022 2038   NA 141 04/26/2022 1008   K 4.0 07/25/2022 2038   CL 109 07/25/2022 2038   CO2 21 (L) 07/25/2022 2038   GLUCOSE 120 (H) 07/25/2022 2038   BUN 11 07/25/2022 2038   BUN 11 04/26/2022 1008   CREATININE 0.88 07/25/2022 2038   CREATININE 0.96 07/30/2015 0916   CALCIUM 8.8 (L) 07/25/2022 2038   GFRNONAA >60 07/25/2022 2038   GFRAA >60 11/22/2018 1146    BNP No results found for: "BNP"  ProBNP No results found for: "PROBNP"  Imaging: MR FOOT RIGHT WO CONTRAST  Result Date: 09/11/2022 CLINICAL DATA:  Foot pain and swelling for 2 months. No specific injury. EXAM: MRI OF THE RIGHT FOREFOOT WITHOUT CONTRAST TECHNIQUE: Multiplanar, multisequence MR imaging of the right foot was performed. No intravenous contrast was administered. COMPARISON:  Radiograph 07/25/2022 FINDINGS: There is a healing stress fracture involving the  base of the second metatarsal with moderate marrow edema and significant callus formation and cortical thickening. Surrounding inflammatory changes are also noted. There are also stress reactions involving the fourth and fifth metatarsals in the proximal shaft regions with marrow edema and surrounding inflammatory changes in the musculature. I do not see a definite stress fracture Moderate age advanced degenerative changes at the tarsal metatarsal joints. Degenerative changes also noted at the talonavicular joint with subchondral cystic change and edema. The MTP joints are maintained. IMPRESSION: 1. Healing stress fracture involving  the base of the second metatarsal. 2. Stress reactions involving the fourth and fifth metatarsals in the proximal shaft regions. 3. Moderate age advanced degenerative changes at the tarsometatarsal joints and talonavicular joint. Electronically Signed   By: Rudie Meyer M.D.   On: 09/11/2022 08:52    ketorolac (TORADOL) injection 60 mg     Date Action Dose Route User   08/14/2022 1541 Given 60 mg Intramuscular (Left Ventrogluteal) Renelda Loma, RMA           No data to display          No results found for: "NITRICOXIDE"      Assessment & Plan:   OSA (obstructive sleep apnea) Severe obstructive sleep apnea-patient is having difficulty tolerating CPAP.  Will adjust CPAP pressure for comfort.  Change to auto CPAP 6 to 12 cm H2O.  Hopefully this will decrease his number of central events as well.  Will change to the DreamWear nasal mask as he is having difficulty with full facemask.  Recheck in 3 months.  - discussed how weight can impact sleep and risk for sleep disordered breathing - discussed options to assist with weight loss: combination of diet modification, cardiovascular and strength training exercises   - had an extensive discussion regarding the adverse health consequences related to untreated sleep disordered breathing - specifically discussed  the risks for hypertension, coronary artery disease, cardiac dysrhythmias, cerebrovascular disease, and diabetes - lifestyle modification discussed   - discussed how sleep disruption can increase risk of accidents, particularly when driving - safe driving practices were discussed   Plan  Patient Instructions  Continue on CPAP at bedtime, goal was to wear all night long for at least 6 or more hours Change CPAP pressure to Auto CPAP 6-12cmH2O.  Try Dream wear nasal mask .  Work on healthy weight loss  Do not drive if sleepy  Follow up in 3 months and As needed        Marathon Oil, NP 09/29/2022

## 2022-09-29 NOTE — Addendum Note (Signed)
Addended by: Delrae Rend on: 09/29/2022 10:51 AM   Modules accepted: Orders

## 2022-09-29 NOTE — Patient Instructions (Addendum)
Continue on CPAP at bedtime, goal was to wear all night long for at least 6 or more hours Change CPAP pressure to Auto CPAP 6-12cmH2O.  Try Dream wear nasal mask .  Work on healthy weight loss  Do not drive if sleepy  Follow up in 3 months and As needed

## 2022-10-02 ENCOUNTER — Ambulatory Visit (INDEPENDENT_AMBULATORY_CARE_PROVIDER_SITE_OTHER): Payer: No Typology Code available for payment source

## 2022-10-02 ENCOUNTER — Encounter: Payer: Self-pay | Admitting: Podiatry

## 2022-10-02 ENCOUNTER — Ambulatory Visit (INDEPENDENT_AMBULATORY_CARE_PROVIDER_SITE_OTHER): Payer: No Typology Code available for payment source | Admitting: Podiatry

## 2022-10-02 ENCOUNTER — Other Ambulatory Visit: Payer: Self-pay | Admitting: Podiatry

## 2022-10-02 ENCOUNTER — Telehealth: Payer: Self-pay | Admitting: Podiatry

## 2022-10-02 DIAGNOSIS — M84374D Stress fracture, right foot, subsequent encounter for fracture with routine healing: Secondary | ICD-10-CM

## 2022-10-02 DIAGNOSIS — S92354A Nondisplaced fracture of fifth metatarsal bone, right foot, initial encounter for closed fracture: Secondary | ICD-10-CM | POA: Diagnosis not present

## 2022-10-02 MED ORDER — OXYCODONE-ACETAMINOPHEN 5-325 MG PO TABS: 1.0000 | ORAL_TABLET | Freq: Four times a day (QID) | ORAL | 0 refills | Status: AC | PRN

## 2022-10-02 MED ORDER — VITAMIN D (ERGOCALCIFEROL) 1.25 MG (50000 UNIT) PO CAPS: 50000.0000 [IU] | ORAL_CAPSULE | ORAL | 0 refills | Status: DC

## 2022-10-02 MED ORDER — HYDROCODONE-ACETAMINOPHEN 5-325 MG PO TABS
1.0000 | ORAL_TABLET | Freq: Four times a day (QID) | ORAL | 0 refills | Status: DC | PRN
Start: 2022-10-02 — End: 2022-10-02

## 2022-10-02 NOTE — Telephone Encounter (Signed)
Pt came by and dropped off FMLA and Short Term Disability forms to be completed by the provider.  Pt has paid for the forms to be completed by the provider. It has been placed  in the folder for completion.  Upon completion the pt would like to come by and pick it up.

## 2022-10-02 NOTE — Progress Notes (Signed)
Subjective: Chief Complaint  Patient presents with   Foot Pain    Right foot pain  Pt stated he felt a sharp pain when taking his kid to the bus stop and he went to the ground     47 year old male presents the office today with concerns.  He states that this morning he was walking in his child to the bus stop and there was a divot in that pavement and his foot went up and he fell because of the pain.  He had immediate pain to his foot.  He presents today for an acute appointment.  No other injuries.    Objective: AAO x3, NAD DP/PT pulses palpable bilaterally, CRT less than 3 seconds Majority discomfort is localized to the lateral aspect of the right foot along the fifth metatarsal.  There is also mild discomfort along the dorsal midfoot.  There is no pain in the ankle.  No proximal tib-fib pain.  There is edema present to the lateral aspect of the foot.  There are no open lesions present. No pain with calf compression, swelling, warmth, erythema  Assessment: Right foot fifth metatarsal fracture  Plan: -All treatment options discussed with the patient including all alternatives, risks, complications.  -X-rays were obtained and reviewed.  Fracture noted along the fifth metatarsal diaphysis.  Radiodensity noted along the second metatarsal base. -Recommend continue immobilization in cam boot.  Limited weightbearing.  Ice, elevation.  Prescribed Vicodin for pain.  I am also can increase his dose of vitamin D to 50,000 units weekly.  Return for fracture in 3-4 weeks, x-ray.  Vivi Barrack DPM

## 2022-10-04 ENCOUNTER — Encounter: Payer: Self-pay | Admitting: Podiatry

## 2022-10-04 NOTE — Telephone Encounter (Signed)
Raynelle Fanning, can you assist with this? He will be out for 6 weeks. Thanks!

## 2022-10-09 ENCOUNTER — Ambulatory Visit (INDEPENDENT_AMBULATORY_CARE_PROVIDER_SITE_OTHER): Payer: No Typology Code available for payment source

## 2022-10-09 ENCOUNTER — Ambulatory Visit (INDEPENDENT_AMBULATORY_CARE_PROVIDER_SITE_OTHER): Payer: No Typology Code available for payment source | Admitting: Podiatry

## 2022-10-09 DIAGNOSIS — S92354D Nondisplaced fracture of fifth metatarsal bone, right foot, subsequent encounter for fracture with routine healing: Secondary | ICD-10-CM | POA: Diagnosis not present

## 2022-10-09 NOTE — Progress Notes (Signed)
Subjective: Chief Complaint  Patient presents with   Fracture    Right foot fracture follow up pt stated that it feels a little better but still has some discomfort     47 year old male presents the office today with concerns.  He states that he is still having some pain and swelling but seems to be improving.  No new injuries or concerns otherwise.  Still in the cam boot he presents today walking in the boot.    Objective: AAO x3, NAD DP/PT pulses palpable bilaterally, CRT less than 3 seconds Right foot: Edema still present of the foot there is no open lesions.  Tenderness is localized to the fifth metatarsal.  There is no pain of the dorsal midfoot particular is no pain along the Lisfranc joint or other metatarsals.  Flexor, extensor tendons are intact. No pain with calf compression, swelling, warmth, erythema  Assessment: Right foot fifth metatarsal fracture  Plan: -All treatment options discussed with the patient including all alternatives, risks, complications.  -Repeat x-rays were obtained and reviewed.  Fracture noted along the fifth metatarsal diaphysis.  Bone callus formation noted. -Recommend continue immobilization in cam boot.  Limited weightbearing.  Ice, elevation.  -Pain medication as needed -Continue vitamin D 50,000 units weekly.  Will recheck vitamin D level next appointment.   X-ray next appointment.  Follow-up as scheduled   Vivi Barrack DPM

## 2022-10-23 ENCOUNTER — Ambulatory Visit (INDEPENDENT_AMBULATORY_CARE_PROVIDER_SITE_OTHER): Payer: No Typology Code available for payment source | Admitting: Podiatry

## 2022-10-23 ENCOUNTER — Ambulatory Visit (INDEPENDENT_AMBULATORY_CARE_PROVIDER_SITE_OTHER): Payer: No Typology Code available for payment source

## 2022-10-23 DIAGNOSIS — S92354D Nondisplaced fracture of fifth metatarsal bone, right foot, subsequent encounter for fracture with routine healing: Secondary | ICD-10-CM

## 2022-10-23 NOTE — Progress Notes (Signed)
Subjective: Chief Complaint  Patient presents with   Fracture     47 year old male presents the office today with concerns.  States he has been feeling better.  Walking in the cam boot.  Does not recall any recent injuries or changes in the saw him last appointment.  No new concerns.  He states that he is still having some pain and swelling but seems to be improving.  No new injuries or concerns otherwise.  Still in the cam boot he presents today walking in the boot.  He is still taking vitamin D supplements.  Objective: AAO x3, NAD DP/PT pulses palpable bilaterally, CRT less than 3 seconds Right foot: Edema still present of the foot there is no open lesions.  There are some slight tenderness palpation the fifth metatarsal diaphysis there is no other areas of discomfort.  Still some mild edema still remains there is no erythema or warmth.  Flexor, extensor tendons appear to be intact. No pain with calf compression, swelling, warmth, erythema  Assessment: Right foot fifth metatarsal fracture  Plan: -All treatment options discussed with the patient including all alternatives, risks, complications.  -Repeat x-rays were obtained and reviewed.  Fracture noted along the fifth metatarsal diaphysis.  Bone callus formation noted.  Increased consolidation noted. -Recommend to continue with immobilization in cam boot.  Limited weightbearing.  Ice, elevation.  -Continue vitamin D 50,000 units weekly.  Will recheck vitamin D level next appointment.   X-ray next appointment.  Vivi Barrack DPM

## 2022-11-02 ENCOUNTER — Other Ambulatory Visit: Payer: Self-pay | Admitting: Nurse Practitioner

## 2022-11-03 NOTE — Telephone Encounter (Signed)
Please advise KH 

## 2022-11-09 ENCOUNTER — Encounter: Payer: Self-pay | Admitting: Podiatry

## 2022-11-09 ENCOUNTER — Ambulatory Visit: Payer: No Typology Code available for payment source | Admitting: Podiatry

## 2022-11-09 ENCOUNTER — Ambulatory Visit (INDEPENDENT_AMBULATORY_CARE_PROVIDER_SITE_OTHER): Payer: No Typology Code available for payment source

## 2022-11-09 DIAGNOSIS — M19071 Primary osteoarthritis, right ankle and foot: Secondary | ICD-10-CM

## 2022-11-09 DIAGNOSIS — E559 Vitamin D deficiency, unspecified: Secondary | ICD-10-CM

## 2022-11-09 DIAGNOSIS — S92354D Nondisplaced fracture of fifth metatarsal bone, right foot, subsequent encounter for fracture with routine healing: Secondary | ICD-10-CM | POA: Diagnosis not present

## 2022-11-09 NOTE — Progress Notes (Signed)
Subjective: 47 year old male presents the office today for follow-up evaluation of fracture to the right fifth metatarsal.  States he is doing better.  Has minimal discomfort.  He feels that he is ready back to work.  Still continue vitamin D.  No new concerns today or any recent injuries.    Objective: AAO x3, NAD- presents wearing regular boots  DP/PT pulses palpable bilaterally, CRT less than 3 seconds Right foot: Presents with a compression sock.  There is slight edema present.  There is no erythema.  He has some minimal discomfort in the fifth metatarsal and somewhat submetatarsal 4.  There is no other areas of pinpoint tenderness identified at this time.  Flexor, extensor tendons.  Intact.  MMT 5/5. No pain with calf compression, swelling, warmth, erythema  Assessment: Right foot fifth metatarsal fracture  Plan: -All treatment options discussed with the patient including all alternatives, risks, complications.  -Repeat x-rays were obtained and reviewed.  Fracture noted along the fifth metatarsal diaphysis.  Bone callus formation noted.  There is found to be increased consolidation.  No evidence of acute fracture. -He is back on a regular shoe.  We discussed going to the subcu the the graphite insert home that he can use.  Will plan to return to work on Monday as he feels that he can end his job.  We discussed wearing stiffer soled shoes while working. -Continue vitam.in D- recheck level  Return in about 6 weeks (around 12/21/2022), or if symptoms worsen or fail to improve, for fracture, x-ray.  Vivi Barrack DPM

## 2022-12-25 ENCOUNTER — Ambulatory Visit: Payer: No Typology Code available for payment source | Admitting: Podiatry

## 2023-01-12 ENCOUNTER — Ambulatory Visit: Payer: Self-pay | Admitting: Nurse Practitioner

## 2023-01-14 ENCOUNTER — Other Ambulatory Visit: Payer: Self-pay | Admitting: Nurse Practitioner

## 2023-01-18 ENCOUNTER — Encounter: Payer: Self-pay | Admitting: Adult Health

## 2023-01-18 ENCOUNTER — Ambulatory Visit: Payer: No Typology Code available for payment source | Admitting: Adult Health

## 2023-02-25 ENCOUNTER — Other Ambulatory Visit: Payer: Self-pay | Admitting: Nurse Practitioner

## 2023-05-04 ENCOUNTER — Encounter: Payer: Self-pay | Admitting: Nurse Practitioner

## 2023-05-04 ENCOUNTER — Ambulatory Visit (INDEPENDENT_AMBULATORY_CARE_PROVIDER_SITE_OTHER): Payer: Self-pay | Admitting: Nurse Practitioner

## 2023-05-04 VITALS — BP 124/89 | HR 75 | Temp 98.2°F | Wt 244.2 lb

## 2023-05-04 DIAGNOSIS — Z13220 Encounter for screening for lipoid disorders: Secondary | ICD-10-CM

## 2023-05-04 DIAGNOSIS — E559 Vitamin D deficiency, unspecified: Secondary | ICD-10-CM

## 2023-05-04 DIAGNOSIS — Z1329 Encounter for screening for other suspected endocrine disorder: Secondary | ICD-10-CM | POA: Diagnosis not present

## 2023-05-04 DIAGNOSIS — Z0000 Encounter for general adult medical examination without abnormal findings: Secondary | ICD-10-CM | POA: Diagnosis not present

## 2023-05-04 DIAGNOSIS — Z1283 Encounter for screening for malignant neoplasm of skin: Secondary | ICD-10-CM

## 2023-05-04 DIAGNOSIS — Z125 Encounter for screening for malignant neoplasm of prostate: Secondary | ICD-10-CM | POA: Diagnosis not present

## 2023-05-04 DIAGNOSIS — R7301 Impaired fasting glucose: Secondary | ICD-10-CM | POA: Diagnosis not present

## 2023-05-04 LAB — POCT GLYCOSYLATED HEMOGLOBIN (HGB A1C): Hemoglobin A1C: 5.7 % — AB (ref 4.0–5.6)

## 2023-05-04 MED ORDER — LEVOTHYROXINE SODIUM 75 MCG PO TABS
75.0000 ug | ORAL_TABLET | Freq: Every day | ORAL | 11 refills | Status: DC
Start: 2023-05-04 — End: 2023-08-13

## 2023-05-04 MED ORDER — VITAMIN D (ERGOCALCIFEROL) 1.25 MG (50000 UNIT) PO CAPS: 50000.0000 [IU] | ORAL_CAPSULE | ORAL | 0 refills | Status: AC

## 2023-05-04 NOTE — Progress Notes (Signed)
 Subjective   Patient ID: Troy Alvarado, male    DOB: 1975/12/04, 48 y.o.   MRN: [id number]  Chief Complaint  Patient presents with   Annual Exam    Referring provider: Ivonne Andrew, NP  Troy Alvarado is a 48 y.o. male with Past Medical History: No date: Chronic low back pain No date: Family history of prostate cancer 2009: History of hyperthyroidism     Comment:  prior oral radioablation therapy No date: Hypothyroidism     Comment:  s/p ablation No date: Impaired fasting blood sugar No date: Obesity No date: Obesity   HPI  Patient presents today for a physical.  He states that he is up-to-date on his colonoscopy which he had done through [facility] 2 years ago.  We are going to call and get records for this.  He did recently have an eye exam on April 1.  A1c in office today is 5.7.  Overall patient is doing well.  He does need labs today.  He does need refill on Synthroid and vitamin D today. Denies f/c/s, n/v/d, hemoptysis, PND, leg swelling Denies chest pain or edema.      No Known Allergies  Immunization History  Administered Date(s) Administered   Influenza,inj,Quad PF,6+ Mos 10/24/2013, 12/06/2017   Tdap 09/24/2013    Tobacco History: Social History   Tobacco Use  Smoking Status Never  Smokeless Tobacco Never  Tobacco Comments   Currently smokes marijuanna.   Counseling given: Not Answered Tobacco comments: Currently smokes marijuanna.   Outpatient Encounter Medications as of 05/04/2023  Medication Sig   [DISCONTINUED] levothyroxine (SYNTHROID) 75 MCG tablet Take 1 tablet (75 mcg total) by mouth daily.   [DISCONTINUED] Vitamin D, Ergocalciferol, (DRISDOL) 1.25 MG (50000 UNIT) CAPS capsule Take 1 capsule (50,000 Units total) by mouth every 7 (seven) days.   levothyroxine (SYNTHROID) 75 MCG tablet Take 1 tablet (75 mcg total) by mouth daily.   Multiple Vitamins-Minerals (MENS 50+ MULTIVITAMIN) TABS Take 1 tablet by mouth daily. (Patient not taking:  Reported on 05/04/2023)   oxyCODONE-acetaminophen (PERCOCET/ROXICET) 5-325 MG tablet Take 1 tablet by mouth every 6 (six) hours as needed for severe pain. (Patient not taking: Reported on 05/04/2023)   Vitamin D, Ergocalciferol, (DRISDOL) 1.25 MG (50000 UNIT) CAPS capsule TAKE 1 CAPSULE (50,000 UNITS TOTAL) BY MOUTH EVERY 7 (SEVEN) DAYS (Patient not taking: Reported on 05/04/2023)   Vitamin D, Ergocalciferol, (DRISDOL) 1.25 MG (50000 UNIT) CAPS capsule Take 1 capsule (50,000 Units total) by mouth every 7 (seven) days.   No facility-administered encounter medications on file as of 05/04/2023.    Review of Systems  Review of Systems  Constitutional: Negative.   HENT: Negative.    Cardiovascular: Negative.   Gastrointestinal: Negative.   Allergic/Immunologic: Negative.   Neurological: Negative.   Psychiatric/Behavioral: Negative.       Objective:   BP 124/89   Pulse 75   Temp 98.2 F (36.8 C) (Oral)   Wt 244 lb 3.2 oz (110.8 kg)   SpO2 98%   BMI 34.06 kg/m   Wt Readings from Last 5 Encounters:  05/04/23 244 lb 3.2 oz (110.8 kg)  09/29/22 240 lb (108.9 kg)  09/20/22 237 lb 1.6 oz (107.5 kg)  08/14/22 241 lb 3.2 oz (109.4 kg)  07/25/22 242 lb (109.8 kg)     Physical Exam Vitals and nursing note reviewed.  Constitutional:      General: He is not in acute distress.    Appearance: He is well-developed.  Cardiovascular:  Rate and Rhythm: Normal rate and regular rhythm.  Pulmonary:     Effort: Pulmonary effort is normal.     Breath sounds: Normal breath sounds.  Skin:    General: Skin is warm and dry.  Neurological:     Mental Status: He is alert and oriented to person, place, and time.       Assessment & Plan:   Routine adult health maintenance -     CBC -     Comprehensive metabolic panel with GFR -     Magnesium  Lipid screening -     Lipid panel  Prostate cancer screening -     PSA  Thyroid disorder screen -     TSH  Vitamin D deficiency -      VITAMIN D 25 Hydroxy (Vit-D Deficiency, Fractures)  Impaired fasting glucose -     POCT glycosylated hemoglobin (Hb A1C)  Skin cancer screening  Other orders -     Levothyroxine Sodium; Take 1 tablet (75 mcg total) by mouth daily.  Dispense: 30 tablet; Refill: 11 -     Vitamin D (Ergocalciferol); Take 1 capsule (50,000 Units total) by mouth every 7 (seven) days.  Dispense: 4 capsule; Refill: 0     Return in about 6 months (around 11/03/2023) for recheck a1c.   Ivonne Andrew, NP 05/04/2023

## 2023-05-04 NOTE — Patient Instructions (Signed)
 Health Maintenance, Male  Adopting a healthy lifestyle and getting preventive care are important in promoting health and wellness. Ask your health care provider about:  The right schedule for you to have regular tests and exams.  Things you can do on your own to prevent diseases and keep yourself healthy.  What should I know about diet, weight, and exercise?  Eat a healthy diet    Eat a diet that includes plenty of vegetables, fruits, low-fat dairy products, and lean protein.  Do not eat a lot of foods that are high in solid fats, added sugars, or sodium.  Maintain a healthy weight  Body mass index (BMI) is a measurement that can be used to identify possible weight problems. It estimates body fat based on height and weight. Your health care provider can help determine your BMI and help you achieve or maintain a healthy weight.  Get regular exercise  Get regular exercise. This is one of the most important things you can do for your health. Most adults should:  Exercise for at least 150 minutes each week. The exercise should increase your heart rate and make you sweat (moderate-intensity exercise).  Do strengthening exercises at least twice a week. This is in addition to the moderate-intensity exercise.  Spend less time sitting. Even light physical activity can be beneficial.  Watch cholesterol and blood lipids  Have your blood tested for lipids and cholesterol at 48 years of age, then have this test every 5 years.  You may need to have your cholesterol levels checked more often if:  Your lipid or cholesterol levels are high.  You are older than 48 years of age.  You are at high risk for heart disease.  What should I know about cancer screening?  Many types of cancers can be detected early and may often be prevented. Depending on your health history and family history, you may need to have cancer screening at various ages. This may include screening for:  Colorectal cancer.  Prostate cancer.  Skin cancer.  Lung  cancer.  What should I know about heart disease, diabetes, and high blood pressure?  Blood pressure and heart disease  High blood pressure causes heart disease and increases the risk of stroke. This is more likely to develop in people who have high blood pressure readings or are overweight.  Talk with your health care provider about your target blood pressure readings.  Have your blood pressure checked:  Every 3-5 years if you are 9-95 years of age.  Every year if you are 85 years old or older.  If you are between the ages of 29 and 29 and are a current or former smoker, ask your health care provider if you should have a one-time screening for abdominal aortic aneurysm (AAA).  Diabetes  Have regular diabetes screenings. This checks your fasting blood sugar level. Have the screening done:  Once every three years after age 23 if you are at a normal weight and have a low risk for diabetes.  More often and at a younger age if you are overweight or have a high risk for diabetes.  What should I know about preventing infection?  Hepatitis B  If you have a higher risk for hepatitis B, you should be screened for this virus. Talk with your health care provider to find out if you are at risk for hepatitis B infection.  Hepatitis C  Blood testing is recommended for:  Everyone born from [birth year] through 1965.  Anyone  with known risk factors for hepatitis C.  Sexually transmitted infections (STIs)  You should be screened each year for STIs, including gonorrhea and chlamydia, if:  You are sexually active and are younger than 48 years of age.  You are older than 48 years of age and your health care provider tells you that you are at risk for this type of infection.  Your sexual activity has changed since you were last screened, and you are at increased risk for chlamydia or gonorrhea. Ask your health care provider if you are at risk.  Ask your health care provider about whether you are at high risk for HIV. Your health care provider  may recommend a prescription medicine to help prevent HIV infection. If you choose to take medicine to prevent HIV, you should first get tested for HIV. You should then be tested every 3 months for as long as you are taking the medicine.  Follow these instructions at home:  Alcohol use  Do not drink alcohol if your health care provider tells you not to drink.  If you drink alcohol:  Limit how much you have to 0-2 drinks a day.  Know how much alcohol is in your drink. In the [place] one drink equals one 12 oz bottle of beer (355 mL), one 5 oz glass of wine (148 mL), or one 1 oz glass of hard liquor (44 mL).  Lifestyle  Do not use any products that contain nicotine or tobacco. These products include cigarettes, chewing tobacco, and vaping devices, such as e-cigarettes. If you need help quitting, ask your health care provider.  Do not use street drugs.  Do not share needles.  Ask your health care provider for help if you need support or information about quitting drugs.  General instructions  Schedule regular health, dental, and eye exams.  Stay current with your vaccines.  Tell your health care provider if:  You often feel depressed.  You have ever been abused or do not feel safe at home.  Summary  Adopting a healthy lifestyle and getting preventive care are important in promoting health and wellness.  Follow your health care provider's instructions about healthy diet, exercising, and getting tested or screened for diseases.  Follow your health care provider's instructions on monitoring your cholesterol and blood pressure.  This information is not intended to replace advice given to you by your health care provider. Make sure you discuss any questions you have with your health care provider.  Document Revised: 05/31/2020 Document Reviewed: 05/31/2020  Elsevier Patient Education  2024 [employer].

## 2023-05-05 LAB — LIPID PANEL

## 2023-05-06 LAB — COMPREHENSIVE METABOLIC PANEL WITH GFR
ALT: 26 IU/L (ref 0–44)
AST: 23 IU/L (ref 0–40)
Albumin: 4.7 g/dL (ref 4.1–5.1)
Alkaline Phosphatase: 90 IU/L (ref 44–121)
BUN/Creatinine Ratio: 15 (ref 9–20)
BUN: 15 mg/dL (ref 6–24)
Bilirubin Total: 0.4 mg/dL (ref 0.0–1.2)
CO2: 21 mmol/L (ref 20–29)
Calcium: 9.8 mg/dL (ref 8.7–10.2)
Chloride: 102 mmol/L (ref 96–106)
Creatinine, Ser: 0.97 mg/dL (ref 0.76–1.27)
Globulin, Total: 2.8 g/dL (ref 1.5–4.5)
Glucose: 85 mg/dL (ref 70–99)
Potassium: 4.6 mmol/L (ref 3.5–5.2)
Sodium: 140 mmol/L (ref 134–144)
Total Protein: 7.5 g/dL (ref 6.0–8.5)
eGFR: 96 mL/min/{1.73_m2} (ref >59)

## 2023-05-06 LAB — VITAMIN D 25 HYDROXY (VIT D DEFICIENCY, FRACTURES): Vit D, 25-Hydroxy: 33.7 ng/mL (ref 30.0–100.0)

## 2023-05-06 LAB — LIPID PANEL
Cholesterol, Total: 229 mg/dL — ABNORMAL HIGH (ref 100–199)
HDL: 78 mg/dL (ref >39)
LDL CALC COMMENT:: 2.9 ratio (ref 0.0–5.0)
LDL Chol Calc (NIH): 138 mg/dL — ABNORMAL HIGH (ref 0–99)
Triglycerides: 76 mg/dL (ref 0–149)
VLDL Cholesterol Cal: 13 mg/dL (ref 5–40)

## 2023-05-06 LAB — PSA: Prostate Specific Ag, Serum: 0.5 ng/mL (ref 0.0–4.0)

## 2023-05-06 LAB — CBC
Hematocrit: 44.4 % (ref 37.5–51.0)
Hemoglobin: 14.4 g/dL (ref 13.0–17.7)
MCH: 29 pg (ref 26.6–33.0)
MCHC: 32.4 g/dL (ref 31.5–35.7)
MCV: 89 fL (ref 79–97)
Platelets: 275 10*3/uL (ref 150–450)
RBC: 4.97 x10E6/uL (ref 4.14–5.80)
RDW: 12.9 % (ref 11.6–15.4)
WBC: 5.8 10*3/uL (ref 3.4–10.8)

## 2023-05-06 LAB — MAGNESIUM: Magnesium: 2.1 mg/dL (ref 1.6–2.3)

## 2023-05-06 LAB — TSH: TSH: 2.64 u[IU]/mL (ref 0.450–4.500)

## 2023-06-08 ENCOUNTER — Other Ambulatory Visit: Payer: Self-pay | Admitting: Nurse Practitioner

## 2023-08-13 ENCOUNTER — Other Ambulatory Visit: Payer: Self-pay

## 2023-08-13 MED ORDER — LEVOTHYROXINE SODIUM 75 MCG PO TABS: 75.0000 ug | ORAL_TABLET | Freq: Every day | ORAL | 3 refills | Status: DC

## 2023-11-05 ENCOUNTER — Ambulatory Visit (INDEPENDENT_AMBULATORY_CARE_PROVIDER_SITE_OTHER): Payer: Self-pay | Admitting: Nurse Practitioner

## 2023-11-05 ENCOUNTER — Encounter: Payer: Self-pay | Admitting: Nurse Practitioner

## 2023-11-05 VITALS — BP 145/86 | HR 77 | Ht 71.0 in | Wt 246.0 lb

## 2023-11-05 DIAGNOSIS — E559 Vitamin D deficiency, unspecified: Secondary | ICD-10-CM

## 2023-11-05 DIAGNOSIS — R350 Frequency of micturition: Secondary | ICD-10-CM

## 2023-11-05 DIAGNOSIS — Z0000 Encounter for general adult medical examination without abnormal findings: Secondary | ICD-10-CM | POA: Diagnosis not present

## 2023-11-05 LAB — POCT URINE DIPSTICK
Bilirubin, UA: NEGATIVE
Glucose, UA: NEGATIVE mg/dL
Ketones, POC UA: NEGATIVE mg/dL
Leukocytes, UA: NEGATIVE
Nitrite, UA: NEGATIVE
POC PROTEIN,UA: NEGATIVE
Spec Grav, UA: >=1.03 — AB (ref 1.010–1.025)
Urobilinogen, UA: 0.2 U/dL
pH, UA: 6 (ref 5.0–8.0)

## 2023-11-05 MED ORDER — VITAMIN D (ERGOCALCIFEROL) 1.25 MG (50000 UNIT) PO CAPS: 50000.0000 [IU] | ORAL_CAPSULE | ORAL | 0 refills | Status: AC

## 2023-11-05 NOTE — Progress Notes (Signed)
 Subjective   Patient ID: Troy Alvarado, male    DOB: 1975/03/23, 48 y.o.   MRN: [id number]  Chief Complaint  Patient presents with   Follow-up    6 month follow up, he stated that he getting up a lot in middle of the night going to the bathroom. Urine has form to it , he stated this has been going a couple     Referring provider: Oley Bascom RAMAN, NP  Troy Alvarado is a 48 y.o. male with Past Medical History: No date: Chronic low back pain No date: Family history of prostate cancer 2009: History of hyperthyroidism     Comment:  prior oral radioablation therapy No date: Hypothyroidism     Comment:  s/p ablation No date: Impaired fasting blood sugar No date: Obesity No date: Obesity   HPI  Patient presents today for follow-up visit.  Overall he has been stable since his last visit.  He has been noticing increased frequency of urination at nighttime.  UA in office was overall normal.  They did show trace of blood.  We will check kidney function and blood work today.  PSA at last visit was normal.  Patient states that he does drink alcohol would like liver function checked as well.  Patient does need a refill on his vitamin D . Denies f/c/s, n/v/d, hemoptysis, PND, leg swelling Denies chest pain or edema     No Known Allergies  Immunization History  Administered Date(s) Administered   Influenza,inj,Quad PF,6+ Mos 10/24/2013, 12/06/2017   Tdap 09/24/2013    Tobacco History: Social History   Tobacco Use  Smoking Status Never  Smokeless Tobacco Never  Tobacco Comments   Currently smokes marijuanna.   Counseling given: Not Answered Tobacco comments: Currently smokes marijuanna.   Outpatient Encounter Medications as of 11/05/2023  Medication Sig   levothyroxine  (SYNTHROID ) 75 MCG tablet Take 1 tablet (75 mcg total) by mouth daily.   Multiple Vitamins-Minerals (MENS 50+ MULTIVITAMIN) TABS Take 1 tablet by mouth daily. (Patient not taking: Reported on 05/04/2023)    oxyCODONE -acetaminophen  (PERCOCET/ROXICET) 5-325 MG tablet Take 1 tablet by mouth every 6 (six) hours as needed for severe pain. (Patient not taking: Reported on 05/04/2023)   Vitamin D , Ergocalciferol , (DRISDOL ) 1.25 MG (50000 UNIT) CAPS capsule Take 1 capsule (50,000 Units total) by mouth every 7 (seven) days. (Patient not taking: Reported on 11/05/2023)   Vitamin D , Ergocalciferol , (DRISDOL ) 1.25 MG (50000 UNIT) CAPS capsule Take 1 capsule (50,000 Units total) by mouth every 7 (seven) days.   [DISCONTINUED] Vitamin D , Ergocalciferol , (DRISDOL ) 1.25 MG (50000 UNIT) CAPS capsule TAKE 1 CAPSULE (50,000 UNITS TOTAL) BY MOUTH EVERY 7 (SEVEN) DAYS (Patient not taking: Reported on 11/05/2023)   No facility-administered encounter medications on file as of 11/05/2023.    Review of Systems  Review of Systems  Constitutional: Negative.   HENT: Negative.    Cardiovascular: Negative.   Gastrointestinal: Negative.   Allergic/Immunologic: Negative.   Neurological: Negative.   Psychiatric/Behavioral: Negative.       Objective:   BP (!) 145/86   Pulse 77   Ht 5' 11 (1.803 m)   Wt 246 lb (111.6 kg)   SpO2 100%   BMI 34.31 kg/m   Wt Readings from Last 5 Encounters:  11/05/23 246 lb (111.6 kg)  05/04/23 244 lb 3.2 oz (110.8 kg)  09/29/22 240 lb (108.9 kg)  09/20/22 237 lb 1.6 oz (107.5 kg)  08/14/22 241 lb 3.2 oz (109.4 kg)     Physical Exam  Vitals and nursing note reviewed.  Constitutional:      General: He is not in acute distress.    Appearance: He is well-developed.  Cardiovascular:     Rate and Rhythm: Normal rate and regular rhythm.  Pulmonary:     Effort: Pulmonary effort is normal.     Breath sounds: Normal breath sounds.  Skin:    General: Skin is warm and dry.  Neurological:     Mental Status: He is alert and oriented to person, place, and time.       Assessment & Plan:   Frequency of urination -     POCT URINE DIPSTICK  Routine adult health maintenance -      CBC -     Comprehensive metabolic panel with GFR  Vitamin D  deficiency -     VITAMIN D  25 Hydroxy (Vit-D Deficiency, Fractures)  Other orders -     Vitamin D  (Ergocalciferol ); Take 1 capsule (50,000 Units total) by mouth every 7 (seven) days.  Dispense: 5 capsule; Refill: 0     Return in about 6 months (around 05/05/2024).   Bascom GORMAN Borer, NP 11/05/2023

## 2023-11-06 LAB — COMPREHENSIVE METABOLIC PANEL WITH GFR
ALT: 22 IU/L (ref 0–44)
AST: 21 IU/L (ref 0–40)
Albumin: 4.5 g/dL (ref 4.1–5.1)
Alkaline Phosphatase: 87 IU/L (ref 47–123)
BUN/Creatinine Ratio: 17 (ref 9–20)
BUN: 16 mg/dL (ref 6–24)
Bilirubin Total: 0.2 mg/dL (ref 0.0–1.2)
CO2: 24 mmol/L (ref 20–29)
Calcium: 9.3 mg/dL (ref 8.7–10.2)
Chloride: 103 mmol/L (ref 96–106)
Creatinine, Ser: 0.95 mg/dL (ref 0.76–1.27)
Globulin, Total: 2.6 g/dL (ref 1.5–4.5)
Glucose: 82 mg/dL (ref 70–99)
Potassium: 4.3 mmol/L (ref 3.5–5.2)
Sodium: 141 mmol/L (ref 134–144)
Total Protein: 7.1 g/dL (ref 6.0–8.5)
eGFR: 99 mL/min/1.73 (ref >59)

## 2023-11-06 LAB — CBC
Hematocrit: 41.8 % (ref 37.5–51.0)
Hemoglobin: 13.6 g/dL (ref 13.0–17.7)
MCH: 29.3 pg (ref 26.6–33.0)
MCHC: 32.5 g/dL (ref 31.5–35.7)
MCV: 90 fL (ref 79–97)
Platelets: 260 x10E3/uL (ref 150–450)
RBC: 4.64 x10E6/uL (ref 4.14–5.80)
RDW: 13.2 % (ref 11.6–15.4)
WBC: 6.2 x10E3/uL (ref 3.4–10.8)

## 2023-11-06 LAB — VITAMIN D 25 HYDROXY (VIT D DEFICIENCY, FRACTURES): Vit D, 25-Hydroxy: 20.9 ng/mL — ABNORMAL LOW (ref 30.0–100.0)

## 2023-11-08 ENCOUNTER — Ambulatory Visit: Payer: Self-pay | Admitting: Nurse Practitioner

## 2023-12-12 ENCOUNTER — Other Ambulatory Visit: Payer: Self-pay

## 2023-12-12 MED ORDER — LEVOTHYROXINE SODIUM 75 MCG PO TABS: 75.0000 ug | ORAL_TABLET | Freq: Every day | ORAL | 0 refills | Status: AC

## 2024-05-05 ENCOUNTER — Ambulatory Visit: Payer: Self-pay | Admitting: Nurse Practitioner
